# Patient Record
Sex: Male | Born: 1970 | Race: Black or African American | Hispanic: No | Marital: Married | Smoking: Never smoker
Health system: Southern US, Community
[De-identification: ages and names within clinical notes are randomized; demographics above are authoritative.]

## PROBLEM LIST (undated history)

## (undated) DIAGNOSIS — G4730 Sleep apnea, unspecified: Secondary | ICD-10-CM

## (undated) DIAGNOSIS — E119 Type 2 diabetes mellitus without complications: Secondary | ICD-10-CM

## (undated) DIAGNOSIS — I10 Essential (primary) hypertension: Secondary | ICD-10-CM

## (undated) DIAGNOSIS — M1990 Unspecified osteoarthritis, unspecified site: Secondary | ICD-10-CM

## (undated) DIAGNOSIS — R569 Unspecified convulsions: Secondary | ICD-10-CM

## (undated) DIAGNOSIS — T7840XA Allergy, unspecified, initial encounter: Secondary | ICD-10-CM

## (undated) HISTORY — DX: Unspecified osteoarthritis, unspecified site: M19.90

## (undated) HISTORY — DX: Type 2 diabetes mellitus without complications: E11.9

## (undated) HISTORY — DX: Allergy, unspecified, initial encounter: T78.40XA

---

## 2011-08-11 ENCOUNTER — Ambulatory Visit (HOSPITAL_BASED_OUTPATIENT_CLINIC_OR_DEPARTMENT_OTHER): Payer: BC Managed Care – PPO

## 2011-09-04 ENCOUNTER — Ambulatory Visit (HOSPITAL_BASED_OUTPATIENT_CLINIC_OR_DEPARTMENT_OTHER): Payer: BC Managed Care – PPO | Attending: Family Medicine | Admitting: Radiology

## 2011-09-04 VITALS — Ht 70.0 in | Wt 220.0 lb

## 2011-09-04 DIAGNOSIS — G4733 Obstructive sleep apnea (adult) (pediatric): Secondary | ICD-10-CM | POA: Insufficient documentation

## 2011-09-06 DIAGNOSIS — G4733 Obstructive sleep apnea (adult) (pediatric): Secondary | ICD-10-CM

## 2011-09-06 NOTE — Procedures (Signed)
[name], Terry York                ACCOUNT NO.:  [id number]  MEDICAL RECORD NO.:  [id number]          PATIENT TYPE:  OUT  LOCATION:  SLEEP CENTER                 FACILITY:  [facility]  PHYSICIAN:  Remus Hagedorn D. Maple Hudson, MD, FCCP, FACPDATE OF BIRTH:  12/18/1972  DATE OF STUDY:  09/04/2011                           NOCTURNAL POLYSOMNOGRAM  REFERRING PHYSICIAN:  Betti D. Pecola Leisure, M.D.  REFERRING PHYSICIAN:  Betti D. Pecola Leisure, M.D.  INDICATION FOR STUDY:  Hypersomnia with sleep apnea.  EPWORTH SLEEPINESS SCORE:  10/24.  BMI 31.6, weight 220 pounds, height 70 inches, neck 18 inches.  HOME MEDICATIONS:  Charted and reviewed.  SLEEP ARCHITECTURE:  Split study protocol.  During the diagnostic phase, total sleep time 121 minutes with sleep efficiency 80.9%.  Stage I was 18.6%, stage II 81%, stage III absent, REM 0.4% of total sleep time. Sleep latency 4.5 minutes, REM latency 86.5 minutes.  Awake after sleep onset 24.5 minutes.  Arousal index 52.1.  Bedtime medication:  None.  RESPIRATORY DATA:  Split study protocol.  Apnea/hypopnea index (AHI) 37.7 per hour.  A total of 76 events was scored including 12 obstructive apneas, 10 central apneas, 54 hypopneas.  Events were seen in all sleep positions.  CPAP was then titrated to 11 CWP, AHI 0 per hour.  He wore a large ResMed Swift mask with heated humidifier.  OXYGEN DATA:  Moderately loud snoring before CPAP with oxygen desaturation to a nadir of 86% on room air.  With CPAP titration, snoring was prevented and mean oxygen saturation held 97.8% on room air.  CARDIAC DATA:  Normal sinus rhythm.  MOVEMENT/PARASOMNIA:  No significant movement disturbance.  No bathroom trips periods.  IMPRESSION/RECOMMENDATION: 1. Severe obstructive and central sleep apnea/hypopnea syndrome, AHI     37.7 per hour with events in all sleep positions, moderately loud     snoring, and oxygen desaturation to a nadir of 86% on room air. 2. Successful CPAP titration to 11 CWP,  AHI 0 per hour.  He wore a     large ResMed Swift FT mask with heated humidifier.  Snoring was     prevented and mean oxygen saturation held 97.8% on room air.     Luz Burcher D. Maple Hudson, MD, [facility], FACP Diplomate, American Board of Sleep Medicine    CDY/MEDQ  D:  09/06/2011 09:42:40  T:  09/06/2011 10:03:36  Job:  [id number]

## 2014-12-12 ENCOUNTER — Other Ambulatory Visit: Payer: Self-pay | Admitting: Gastroenterology

## 2014-12-27 ENCOUNTER — Encounter (HOSPITAL_COMMUNITY): Payer: Self-pay | Admitting: *Deleted

## 2015-01-05 ENCOUNTER — Ambulatory Visit (HOSPITAL_COMMUNITY): Payer: BC Managed Care – PPO | Admitting: Registered Nurse

## 2015-01-05 ENCOUNTER — Encounter (HOSPITAL_COMMUNITY): Payer: Self-pay | Admitting: *Deleted

## 2015-01-05 ENCOUNTER — Ambulatory Visit (HOSPITAL_COMMUNITY)
Admission: RE | Admit: 2015-01-05 | Discharge: 2015-01-05 | Disposition: A | Discharge status: Home / Self Care | Payer: BC Managed Care – PPO | Source: Ambulatory Visit | Attending: Gastroenterology | Admitting: Gastroenterology

## 2015-01-05 ENCOUNTER — Encounter (HOSPITAL_COMMUNITY)
Admission: RE | Disposition: A | Discharge status: Home / Self Care | Payer: Self-pay | Source: Ambulatory Visit | Attending: Gastroenterology

## 2015-01-05 DIAGNOSIS — Z1211 Encounter for screening for malignant neoplasm of colon: Secondary | ICD-10-CM | POA: Insufficient documentation

## 2015-01-05 DIAGNOSIS — D124 Benign neoplasm of descending colon: Secondary | ICD-10-CM | POA: Diagnosis not present

## 2015-01-05 DIAGNOSIS — K602 Anal fissure, unspecified: Secondary | ICD-10-CM | POA: Diagnosis not present

## 2015-01-05 DIAGNOSIS — G4730 Sleep apnea, unspecified: Secondary | ICD-10-CM | POA: Diagnosis not present

## 2015-01-05 DIAGNOSIS — K621 Rectal polyp: Secondary | ICD-10-CM | POA: Insufficient documentation

## 2015-01-05 DIAGNOSIS — K644 Residual hemorrhoidal skin tags: Secondary | ICD-10-CM | POA: Insufficient documentation

## 2015-01-05 DIAGNOSIS — Z800 Family history of malignant neoplasm of digestive organs: Secondary | ICD-10-CM | POA: Diagnosis not present

## 2015-01-05 DIAGNOSIS — K642 Third degree hemorrhoids: Secondary | ICD-10-CM | POA: Diagnosis not present

## 2015-01-05 HISTORY — PX: COLONOSCOPY WITH PROPOFOL: SHX5780

## 2015-01-05 HISTORY — DX: Sleep apnea, unspecified: G47.30

## 2015-01-05 SURGERY — COLONOSCOPY WITH PROPOFOL
Anesthesia: Monitor Anesthesia Care

## 2015-01-05 MED ORDER — PROPOFOL 10 MG/ML IV BOLUS
INTRAVENOUS | Status: AC
  Filled 2015-01-05: qty 20

## 2015-01-05 MED ORDER — PROPOFOL 10 MG/ML IV BOLUS
INTRAVENOUS | Status: DC | PRN
  Administered 2015-01-05: 20 mg via INTRAVENOUS

## 2015-01-05 MED ORDER — PROPOFOL 500 MG/50ML IV EMUL
INTRAVENOUS | Status: DC | PRN
  Administered 2015-01-05: 125 ug/kg/min via INTRAVENOUS

## 2015-01-05 MED ORDER — LIDOCAINE HCL (CARDIAC) 20 MG/ML IV SOLN
INTRAVENOUS | Status: DC | PRN
  Administered 2015-01-05: 100 mg via INTRAVENOUS

## 2015-01-05 MED ORDER — LIDOCAINE HCL (CARDIAC) 20 MG/ML IV SOLN
INTRAVENOUS | Status: AC
  Filled 2015-01-05: qty 5

## 2015-01-05 MED ORDER — LACTATED RINGERS IV SOLN
INTRAVENOUS | Status: DC
  Administered 2015-01-05: 1000 mL via INTRAVENOUS

## 2015-01-05 MED ORDER — SODIUM CHLORIDE 0.9 % IV SOLN: INTRAVENOUS | Status: DC

## 2015-01-05 SURGICAL SUPPLY — 21 items

## 2015-01-05 NOTE — Anesthesia Preprocedure Evaluation (Addendum)
Anesthesia Evaluation  Patient identified by MRN, date of birth, ID band Patient awake    Reviewed: Allergy & Precautions, NPO status , Patient's Chart, lab work & pertinent test results  Airway Mallampati: II   Neck ROM: Full    Dental   Pulmonary neg pulmonary ROS,    breath sounds clear to auscultation       Cardiovascular negative cardio ROS   Rhythm:Regular Rate:Normal     Neuro/Psych    GI/Hepatic Neg liver ROS, GI history noted. CE   Endo/Other    Renal/GU negative Renal ROS     Musculoskeletal   Abdominal   Peds  Hematology   Anesthesia Other Findings   Reproductive/Obstetrics                           Anesthesia Physical Anesthesia Plan  ASA: II  Anesthesia Plan: MAC   Post-op Pain Management:    Induction: Intravenous  Airway Management Planned: Nasal Cannula, Natural Airway and Simple Face Mask  Additional Equipment:   Intra-op Plan:   Post-operative Plan:   Informed Consent: I have reviewed the patients History and Physical, chart, labs and discussed the procedure including the risks, benefits and alternatives for the proposed anesthesia with the patient or authorized representative who has indicated his/her understanding and acceptance.   Dental advisory given  Plan Discussed with: CRNA and Anesthesiologist  Anesthesia Plan Comments:        Anesthesia Quick Evaluation

## 2015-01-05 NOTE — Discharge Instructions (Signed)
Monitored Anesthesia Care Monitored anesthesia care is an anesthesia service for a medical procedure. Anesthesia is the loss of the ability to feel pain. It is produced by medicines called anesthetics. It may affect a small area of your body (local anesthesia), a large area of your body (regional anesthesia), or your entire body (general anesthesia). The need for monitored anesthesia care depends your procedure, your condition, and the potential need for regional or general anesthesia. It is often provided during procedures where:   General anesthesia may be needed if there are complications. This is because you need special care when you are under general anesthesia.   You will be under local or regional anesthesia. This is so that you are able to have higher levels of anesthesia if needed.   You will receive calming medicines (sedatives). This is especially the case if sedatives are given to put you in a semi-conscious state of relaxation (deep sedation). This is because the amount of sedative needed to produce this state can be hard to predict. Too much of a sedative can produce general anesthesia. Monitored anesthesia care is performed by one or more health care providers who have special training in all types of anesthesia. You will need to meet with these health care providers before your procedure. During this meeting, they will ask you about your medical history. They will also give you instructions to follow. (For example, you will need to stop eating and drinking before your procedure. You may also need to stop or change medicines you are taking.) During your procedure, your health care providers will stay with you. They will:   Watch your condition. This includes watching your blood pressure, breathing, and level of pain.   Diagnose and treat problems that occur.   Give medicines if they are needed. These may include calming medicines (sedatives) and anesthetics.   Make sure you are  comfortable.  Having monitored anesthesia care does not necessarily mean that you will be under anesthesia. It does mean that your health care providers will be able to manage anesthesia if you need it or if it occurs. It also means that you will be able to have a different type of anesthesia than you are having if you need it. When your procedure is complete, your health care providers will continue to watch your condition. They will make sure any medicines wear off before you are allowed to go home.  Document Released: 12/18/2004 Document Revised: 08/08/2013 Document Reviewed: 05/05/2012 [facility] Patient Information 2015 [place], [place]. This information is not intended to replace advice given to you by your health care provider. Make sure you discuss any questions you have with your health care provider. Colonoscopy, Care After Refer to this sheet in the next few weeks. These instructions provide you with information on caring for yourself after your procedure. Your health care provider may also give you more specific instructions. Your treatment has been planned according to current medical practices, but problems sometimes occur. Call your health care provider if you have any problems or questions after your procedure. WHAT TO EXPECT AFTER THE PROCEDURE  After your procedure, it is typical to have the following:  A small amount of blood in your stool.  Moderate amounts of gas and mild abdominal cramping or bloating. HOME CARE INSTRUCTIONS  Do not drive, operate machinery, or sign important documents for 24 hours.  You may shower and resume your regular physical activities, but move at a slower pace for the first 24 hours.  Take frequent rest periods for the first 24 hours.  Walk around or put a warm pack on your abdomen to help reduce abdominal cramping and bloating.  Drink enough fluids to keep your urine clear or pale yellow.  You may resume your normal diet as instructed by your  health care provider. Avoid heavy or fried foods that are hard to digest.  Avoid drinking alcohol for 24 hours or as instructed by your health care provider.  Only take over-the-counter or prescription medicines as directed by your health care provider.  If a tissue sample (biopsy) was taken during your procedure:  Do not take aspirin or blood thinners for 7 days, or as instructed by your health care provider.  Do not drink alcohol for 7 days, or as instructed by your health care provider.  Eat soft foods for the first 24 hours. SEEK MEDICAL CARE IF: You have persistent spotting of blood in your stool 2-3 days after the procedure. SEEK IMMEDIATE MEDICAL CARE IF:  You have more than a small spotting of blood in your stool.  You pass large blood clots in your stool.  Your abdomen is swollen (distended).  You have nausea or vomiting.  You have a fever.  You have increasing abdominal pain that is not relieved with medicine. Document Released: 11/06/2003 Document Revised: 01/12/2013 Document Reviewed: 11/29/2012 [facility] Patient Information 2015 [place], [place]. This information is not intended to replace advice given to you by your health care provider. Make sure you discuss any questions you have with your health care provider.

## 2015-01-05 NOTE — Op Note (Signed)
[facility] [place], [address]   COLONOSCOPY PROCEDURE REPORT  PATIENT: Terry York, Terry York  MR#: [id number] BIRTHDATE: September 14, 1970 , 44  yrs. old GENDER: male ENDOSCOPIST: Carol Ada, MD REFERRED BY: PROCEDURE DATE:  10-Jan-2015 PROCEDURE:   Colonoscopy with snare polypectomy ASA CLASS:   Class III INDICATIONS: Choledocholithiasis MEDICATIONS: Monitored anesthesia care  DESCRIPTION OF PROCEDURE:   After the risks and benefits and of the procedure were explained, informed consent was obtained.  Prolapsed large int/ext hemorrhoids         The Pentax Ped Colon [id number] endoscope was introduced through the anus and advanced to the cecum, which was identified by both the appendix and ileocecal valve .  The quality of the prep was excellent. .  The instrument was then slowly withdrawn as the colon was fully examined. Estimated blood loss is zero unless otherwise noted in this procedure report.   FINDINGS: Around the splenic flexure region to 1 cm pedunculated polyps were removed with a hot snare.  A 2 mm sessile polyp in the smae region was removed with a cold snare.  No evidence of any inflammation, ulcerations, erosions, or vascualr abnormalities. Retroflexed views revealed no abnormalities.     The scope was then withdrawn from the patient and the procedure completed.  WITHDRAWAL TIME: 12 minutes  COMPLICATIONS: There were no immediate complications. ENDOSCOPIC IMPRESSION: 1) Polyps 2) Grade 3 prolapse of int and ext hemorrhoids.  RECOMMENDATIONS: 1) Follow up biopsie results. 2) Repeat the colonoscopy in 3 years.  REPEAT EXAM:  cc: _______________________________ eSignedCarol Ada, MD 01/10/2015 10:27 AM  CPT CODES: ICD CODES:

## 2015-01-05 NOTE — Anesthesia Postprocedure Evaluation (Signed)
  Anesthesia Post-op Note  Patient: Terry York  Procedure(s) Performed: Procedure(s): COLONOSCOPY WITH PROPOFOL (N/A)  Patient Location: PACU  Anesthesia Type:MAC  Level of Consciousness: awake  Airway and Oxygen Therapy: Patient Spontanous Breathing  Post-op Pain: mild  Post-op Assessment: Post-op Vital signs reviewed              Post-op Vital Signs: Reviewed  Last Vitals:  Filed Vitals:   01/05/15 1040  BP: 143/90  Temp:   Resp:     Complications: No apparent anesthesia complications

## 2015-01-05 NOTE — Anesthesia Postprocedure Evaluation (Signed)
  Anesthesia Post-op Note  Patient: Terry York  Procedure(s) Performed: Procedure(s): COLONOSCOPY WITH PROPOFOL (N/A)  Patient Location: PACU  Anesthesia Type:MAC  Level of Consciousness: alert   Airway and Oxygen Therapy: Patient Spontanous Breathing  Post-op Pain: mild  Post-op Assessment: Post-op Vital signs reviewed              Post-op Vital Signs: Reviewed  Last Vitals:  Filed Vitals:   01/05/15 0845  BP: 174/97  Temp: 36.8 C  Resp: 16    Complications: No apparent anesthesia complications

## 2015-01-05 NOTE — Anesthesia Procedure Notes (Signed)
Procedure Name: MAC Date/Time: 01/05/2015 9:58 AM Performed by: Carleene Cooper A Pre-anesthesia Checklist: Patient identified, Timeout performed, Emergency Drugs available, Suction available and Patient being monitored Patient Re-evaluated:Patient Re-evaluated prior to inductionOxygen Delivery Method: Simple face mask Preoxygenation: Pre-oxygenation with 100% oxygen Dental Injury: Teeth and Oropharynx as per pre-operative assessment

## 2015-01-05 NOTE — Transfer of Care (Signed)
Immediate Anesthesia Transfer of Care Note  Patient: Terry York  Procedure(s) Performed: Procedure(s): COLONOSCOPY WITH PROPOFOL (N/A)  Patient Location: PACU  Anesthesia Type:MAC  Level of Consciousness: awake, alert , oriented and patient cooperative  Airway & Oxygen Therapy: Patient Spontanous Breathing and Patient connected to face mask oxygen  Post-op Assessment: Report given to RN, Post -op Vital signs reviewed and stable and Patient moving all extremities  Post vital signs: Reviewed and stable  Last Vitals:  Filed Vitals:   01/05/15 0845  BP: 174/97  Temp: 36.8 C  Resp: 16    Complications: No apparent anesthesia complications

## 2015-01-05 NOTE — H&P (Signed)
  Terry York HPI: This is a 44 year old male with a family history of colon cancer in his maternal grandmother at the age of 44.  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  No prior colonoscopy and no complaints of any GI issues at this time, i.e., hematochezia, melena, ABM pain, diarrhea, or constipation.  He has an AHI score of 37.7 for his sleep apnea.  Past Medical History  Diagnosis Date  . Sleep apnea     History reviewed. No pertinent past surgical history.  History reviewed. No pertinent family history.  Social History:  reports that he has never smoked. He does not have any smokeless tobacco history on file. He reports that he drinks alcohol. He reports that he does not use illicit drugs.  Allergies: No Known Allergies  Medications:  Scheduled:  Continuous: . lactated ringers 1,000 mL (01/05/15 0841)    No results found for this or any previous visit (from the past 24 hour(s)).   No results found.  ROS:  As stated above in the HPI otherwise negative.  Blood pressure 174/97, temperature 98.3 F (36.8 C), temperature source Oral, resp. rate 16, height 5\' 10"  (1.778 m), weight 108.863 kg (240 lb), SpO2 97 %.    PE: Gen: NAD, Alert and Oriented HEENT:  [place]/AT, EOMI Neck: Supple, no LAD Lungs: CTA Bilaterally CV: RRR without M/G/R ABM: Soft, NTND, +BS Ext: No C/C/E  Assessment/Plan: 1) Family history of colon cancer - Screening colonoscopy.  HUNG,PATRICK D 01/05/2015, 9:46 AM

## 2017-08-11 ENCOUNTER — Encounter: Payer: Self-pay | Admitting: Sports Medicine

## 2017-08-11 ENCOUNTER — Ambulatory Visit: Payer: BC Managed Care – PPO | Admitting: Sports Medicine

## 2017-08-11 ENCOUNTER — Ambulatory Visit (INDEPENDENT_AMBULATORY_CARE_PROVIDER_SITE_OTHER): Payer: BC Managed Care – PPO

## 2017-08-11 ENCOUNTER — Other Ambulatory Visit: Payer: Self-pay | Admitting: *Deleted

## 2017-08-11 ENCOUNTER — Encounter

## 2017-08-11 VITALS — BP 148/96 | HR 83 | Resp 16

## 2017-08-11 DIAGNOSIS — M2011 Hallux valgus (acquired), right foot: Secondary | ICD-10-CM | POA: Diagnosis not present

## 2017-08-11 DIAGNOSIS — M1000 Idiopathic gout, unspecified site: Secondary | ICD-10-CM

## 2017-08-11 DIAGNOSIS — M7662 Achilles tendinitis, left leg: Secondary | ICD-10-CM | POA: Diagnosis not present

## 2017-08-11 DIAGNOSIS — M779 Enthesopathy, unspecified: Secondary | ICD-10-CM

## 2017-08-11 MED ORDER — IBUPROFEN 800 MG PO TABS: 800.0000 mg | ORAL_TABLET | Freq: Two times a day (BID) | ORAL | 0 refills | Status: DC

## 2017-08-11 MED ORDER — TRIAMCINOLONE ACETONIDE 10 MG/ML IJ SUSP: 10.0000 mg | Freq: Once | INTRAMUSCULAR | Status: DC

## 2017-08-11 MED ORDER — METHYLPREDNISOLONE 4 MG PO TBPK: ORAL_TABLET | ORAL | 0 refills | Status: DC

## 2017-08-11 NOTE — Patient Instructions (Addendum)

## 2017-08-11 NOTE — Progress Notes (Signed)
Subjective: Terry York is a 47 y.o. male patient who presents to office for evaluation of Right> Left foot pain. Patient complains of progressive pain especially over the since May 3rd Right>Left foot at the big toe joint. Ranks pain 7/10. Admits to warmth, redness, and swelling to the area that is unrelieved. Patient has tried rest, ice, elevation with no relief in symptoms. Admits to previous gouty attack years ago. Patient also states that the back of his left heel was hurting but now better. Patient denies any other pedal complaints.   Review of Systems  Musculoskeletal: Positive for joint pain.  All other systems reviewed and are negative.   There are no active problems to display for this patient.   Current Outpatient Medications on File Prior to Visit  Medication Sig Dispense Refill  . diltiazem 2 % GEL Apply 1 application topically 3 (three) times daily.     No current facility-administered medications on file prior to visit.     No Known Allergies  Objective:  General: Alert and oriented x3 in no acute distress  Dermatology: Focal Swelling, warmth, redness present on the Right foot at 1st MTPJ, No open lesions bilateral lower extremities, no webspace macerations, no ecchymosis bilateral, all nails x 10 are well manicured.   Vascular: Dorsalis Pedis and Posterior Tibial pedal pulses 1/4, Capillary Fill Time 3 seconds,(+) pedal hair growth bilateral,Temperature gradient increased over the Right 1st MTPJ.  Neurology: [name] sensation intact via light touch bilateral.  Musculoskeletal: There is tenderness with palpation at 1st MTPJ on right foot,No pain with calf compression bilateral. All joint range of motion is within normal limits except at the right 1st MTPJ where there is pain and limiation, Strength within normal limits in all groups bilateral. Resolved insertion pain at left achilles with heel spur.   Gait: Unassisted, Antalgic gait avoiding weight on left/right  foot  Xrays  Left/Right Foot    Impression: Soft tissue swelling at right 1st MTPJ, calcaneal spur, mild arthritis and bunion on right, no other acute findings.        Assessment and Plan: Problem List Items Addressed This Visit    None    Visit Diagnoses    Acute idiopathic gout, unspecified site    -  Primary   Relevant Medications   triamcinolone acetonide (KENALOG) 10 MG/ML injection 10 mg   Other Relevant Orders   ANA, IFA Comprehensive Panel   C-reactive protein   Uric acid   Sedimentation rate   Rheumatoid factor   Hallux valgus of right foot       Relevant Orders   DG Foot Complete Right (Completed)   Tendonitis, Achilles, left       Relevant Orders   DG Foot Complete Left (Completed)       -Complete examination performed -Xrays reviewed -Discussed treatment for achilles tendonitis; recommend cont to stretch to prevent recurrence  -Discussed treatement options for gouty arthritis and gout education provided - After oral consent, injected right 1st MTPJ with 1cc lidocaine and marcaine plain mixed with 0.5cc Kenalog-10 and Dexmethasone phosphate without complication; post injection care explained. -Rx Medrol  -Ordered arthritic lab panel; will call patient with results if abnormal -Advised patient to call if symptoms are not improved within 1 week -Patient to return in 3-4 weeks for re-check/further discussion for long term management of gout or sooner if condition worsens.  Landis Martins, DPM'

## 2017-08-13 ENCOUNTER — Telehealth: Payer: Self-pay | Admitting: Sports Medicine

## 2017-08-13 LAB — ANA, IFA COMPREHENSIVE PANEL
Anti Nuclear Antibody(ANA): NEGATIVE
ENA SM AB SER-ACNC: NEGATIVE AI
SM/RNP: NEGATIVE AI
SSA (Ro) (ENA) Antibody, IgG: <1 AI
SSB (La) (ENA) Antibody, IgG: <1 AI
Scleroderma (Scl-70) (ENA) Antibody, IgG: <1 AI
ds DNA Ab: 1 IU/mL

## 2017-08-13 LAB — SEDIMENTATION RATE: Sed Rate: 11 mm/h (ref 0–15)

## 2017-08-13 LAB — C-REACTIVE PROTEIN: CRP: 14 mg/L — ABNORMAL HIGH (ref <8.0)

## 2017-08-13 LAB — URIC ACID: URIC ACID, SERUM: 5.9 mg/dL (ref 4.0–8.0)

## 2017-08-13 LAB — RHEUMATOID FACTOR: Rhuematoid fact SerPl-aCnc: <14 IU/mL (ref <14)

## 2017-08-13 NOTE — Telephone Encounter (Signed)
I told pt the uric acid was normal, but he could still experience symptoms of gout, and once Dr. Cannon Kettle had reviewed the remaining labs, I would call with further instructions. Pt states understanding.

## 2017-08-13 NOTE — Telephone Encounter (Signed)
I was calling to see if my lab results have come in regarding my uric acid level? I'm just trying to follow up on my visit from a couple of days ago, and if I have any additional instructions for my care, that would be very useful. My number is [phone]. Thank you much. Bye bye.

## 2017-08-19 ENCOUNTER — Ambulatory Visit: Payer: BC Managed Care – PPO | Admitting: Podiatry

## 2017-08-26 ENCOUNTER — Other Ambulatory Visit: Payer: Self-pay | Admitting: Sports Medicine

## 2017-09-01 ENCOUNTER — Ambulatory Visit: Payer: BC Managed Care – PPO | Admitting: Sports Medicine

## 2017-09-08 ENCOUNTER — Encounter: Payer: Self-pay | Admitting: Sports Medicine

## 2017-09-08 ENCOUNTER — Ambulatory Visit: Payer: BC Managed Care – PPO | Admitting: Sports Medicine

## 2017-09-08 DIAGNOSIS — M7662 Achilles tendinitis, left leg: Secondary | ICD-10-CM | POA: Diagnosis not present

## 2017-09-08 DIAGNOSIS — M2011 Hallux valgus (acquired), right foot: Secondary | ICD-10-CM

## 2017-09-08 DIAGNOSIS — M1000 Idiopathic gout, unspecified site: Secondary | ICD-10-CM

## 2017-09-08 NOTE — Progress Notes (Signed)
Subjective: Terry York is a 47 y.o. male patient who returns to office for follow up evaluation of bilateral foot pain. Patient now ranks pain 1/10 and states that he is feeling better after injection on right and completed medication for left. Admits to a sprain of last week that is also better as well. Patient denies any other pedal complaints.   There are no active problems to display for this patient.  Current Outpatient Medications on File Prior to Visit  Medication Sig Dispense Refill  . B-D TB SYRINGE 1CC/27GX1/2" 27G X 1/2" 1 ML MISC See admin instructions.  6  . cyanocobalamin (,VITAMIN B-12,) 1000 MCG/ML injection See admin instructions.  6  . diltiazem 2 % GEL Apply 1 application topically 3 (three) times daily.    . hydrocortisone (ANUSOL-HC) 25 MG suppository     . ibuprofen (ADVIL,MOTRIN) 800 MG tablet TAKE 1 TABLET (800 MG TOTAL) BY MOUTH 2 (TWO) TIMES DAILY. 30 tablet 0  . ibuprofen (ADVIL,MOTRIN) 800 MG tablet TAKE 1 TABLET (800 MG TOTAL) BY MOUTH 2 (TWO) TIMES DAILY. 30 tablet 0  . imiquimod (ALDARA) 5 % cream     . lisinopril (PRINIVIL,ZESTRIL) 20 MG tablet TAKE 1 TABLET (20 MG) BY ORAL ROUTE ONCE DAILY  1  . methylPREDNISolone (MEDROL DOSEPAK) 4 MG TBPK tablet Take as directed 21 tablet 0  . rosuvastatin (CRESTOR) 10 MG tablet Take 10 mg by mouth daily.  4  . tadalafil (CIALIS) 5 MG tablet TAKE 1 TABLET (5 MG) BY ORAL ROUTE ONCE DAILY  4  . testosterone cypionate (DEPOTESTOSTERONE CYPIONATE) 200 MG/ML injection INJECT 1MLS INTRAMUSCULARLY EVERY 2 WEEKS  5   Current Facility-Administered Medications on File Prior to Visit  Medication Dose Route Frequency Provider Last Rate Last Dose  . triamcinolone acetonide (KENALOG) 10 MG/ML injection 10 mg  10 mg Other Once Landis Martins, DPM       No Known Allergies  Objective:  General: Alert and oriented x3 in no acute distress  Dermatology: No redness, swelling or warmth at Right 1st MTPJ, No open lesions bilateral lower  extremities, no webspace macerations, no ecchymosis bilateral, all nails x 10 are well manicured.  Neurovascular: Intact. No lower extremity edema bilateral.   Musculoskeletal:No tenderness with palpation at R 1st MTPJ,No pain with calf compression bilateral. Range of motion in all pedal joints are within normal limits without pain or crepitation. Strength within normal limits in all groups bilateral, no reproducible pain with stressing right midtarsal joint or to left achilles insertion.   Assessment and Plan: Problem List Items Addressed This Visit    None    Visit Diagnoses    Hallux valgus of right foot    -  Primary   Acute idiopathic gout, unspecified site       Tendonitis, Achilles, left          -Complete examination performed -Discussed long term care for bunion with hx of gout on right and tendonitis on left -Previous labs were reviewed  -Recommend follow up with PCP for long term gout management -Encouraged dietary management for gout -Recommend good supportive shoes to avoid rubbing to right bunion -Continue with stretching exercises for left and orthotics  -Patient to return to office as needed or sooner if condition worsens.  Landis Martins, DPM

## 2017-09-13 ENCOUNTER — Other Ambulatory Visit: Payer: Self-pay | Admitting: Sports Medicine

## 2017-10-09 ENCOUNTER — Other Ambulatory Visit: Payer: Self-pay | Admitting: Sports Medicine

## 2017-12-21 ENCOUNTER — Ambulatory Visit
Admission: RE | Admit: 2017-12-21 | Discharge: 2017-12-21 | Disposition: A | Discharge status: Home / Self Care | Payer: BC Managed Care – PPO | Source: Ambulatory Visit | Attending: Family Medicine | Admitting: Family Medicine

## 2017-12-21 ENCOUNTER — Other Ambulatory Visit: Payer: Self-pay | Admitting: Family Medicine

## 2017-12-21 DIAGNOSIS — S6992XA Unspecified injury of left wrist, hand and finger(s), initial encounter: Secondary | ICD-10-CM

## 2017-12-22 ENCOUNTER — Other Ambulatory Visit: Payer: Self-pay | Admitting: Gastroenterology

## 2018-01-12 ENCOUNTER — Encounter (HOSPITAL_COMMUNITY): Admission: RE | Payer: Self-pay | Source: Ambulatory Visit

## 2018-01-12 ENCOUNTER — Ambulatory Visit (HOSPITAL_COMMUNITY)
Admission: RE | Admit: 2018-01-12 | Payer: BC Managed Care – PPO | Source: Ambulatory Visit | Admitting: Gastroenterology

## 2018-01-12 SURGERY — COLONOSCOPY WITH PROPOFOL
Anesthesia: Monitor Anesthesia Care

## 2018-05-12 ENCOUNTER — Other Ambulatory Visit: Payer: Self-pay | Admitting: Gastroenterology

## 2018-08-12 ENCOUNTER — Ambulatory Visit (HOSPITAL_COMMUNITY): Admit: 2018-08-12 | Payer: BC Managed Care – PPO | Admitting: Gastroenterology

## 2018-08-12 ENCOUNTER — Encounter (HOSPITAL_COMMUNITY): Payer: Self-pay

## 2018-08-12 SURGERY — COLONOSCOPY WITH PROPOFOL
Anesthesia: Monitor Anesthesia Care

## 2018-10-04 ENCOUNTER — Other Ambulatory Visit: Payer: Self-pay | Admitting: Gastroenterology

## 2018-11-01 ENCOUNTER — Other Ambulatory Visit (HOSPITAL_COMMUNITY)
Admission: RE | Admit: 2018-11-01 | Discharge: 2018-11-01 | Disposition: A | Discharge status: Home / Self Care | Payer: BC Managed Care – PPO | Source: Ambulatory Visit | Attending: Gastroenterology | Admitting: Gastroenterology

## 2018-11-01 DIAGNOSIS — Z20828 Contact with and (suspected) exposure to other viral communicable diseases: Secondary | ICD-10-CM | POA: Insufficient documentation

## 2018-11-01 LAB — SARS CORONAVIRUS 2 (TAT 6-24 HRS): SARS Coronavirus 2: NEGATIVE

## 2018-11-02 ENCOUNTER — Encounter (HOSPITAL_COMMUNITY): Payer: Self-pay | Admitting: *Deleted

## 2018-11-02 ENCOUNTER — Other Ambulatory Visit: Payer: Self-pay

## 2018-11-03 NOTE — Anesthesia Preprocedure Evaluation (Addendum)
Anesthesia Evaluation  Patient identified by MRN, date of birth, ID band Patient awake    Reviewed: Allergy & Precautions, NPO status , Patient's Chart, lab work & pertinent test results  Airway Mallampati: III  TM Distance: >3 FB Neck ROM: Full    Dental no notable dental hx.    Pulmonary sleep apnea and Continuous Positive Airway Pressure Ventilation ,    Pulmonary exam normal breath sounds clear to auscultation       Cardiovascular hypertension, Pt. on medications Normal cardiovascular exam Rhythm:Regular Rate:Normal     Neuro/Psych negative neurological ROS  negative psych ROS   GI/Hepatic negative GI ROS, Neg liver ROS,   Endo/Other  negative endocrine ROS  Renal/GU negative Renal ROS     Musculoskeletal negative musculoskeletal ROS (+)   Abdominal (+) + obese,   Peds  Hematology negative hematology ROS (+)   Anesthesia Other Findings screening/fm hx colon cancer  Reproductive/Obstetrics                            Anesthesia Physical Anesthesia Plan  ASA: III  Anesthesia Plan: MAC   Post-op Pain Management:    Induction: Intravenous  PONV Risk Score and Plan: 1 and Propofol infusion and Treatment may vary due to age or medical condition  Airway Management Planned: Simple Face Mask  Additional Equipment:   Intra-op Plan:   Post-operative Plan:   Informed Consent: I have reviewed the patients History and Physical, chart, labs and discussed the procedure including the risks, benefits and alternatives for the proposed anesthesia with the patient or authorized representative who has indicated his/her understanding and acceptance.     Dental advisory given  Plan Discussed with: CRNA  Anesthesia Plan Comments:        Anesthesia Quick Evaluation

## 2018-11-04 ENCOUNTER — Encounter (HOSPITAL_COMMUNITY)
Admission: RE | Disposition: A | Discharge status: Home / Self Care | Payer: Self-pay | Source: Home / Self Care | Attending: Gastroenterology

## 2018-11-04 ENCOUNTER — Encounter (HOSPITAL_COMMUNITY): Payer: Self-pay | Admitting: *Deleted

## 2018-11-04 ENCOUNTER — Ambulatory Visit (HOSPITAL_COMMUNITY)
Admission: RE | Admit: 2018-11-04 | Discharge: 2018-11-04 | Disposition: A | Discharge status: Home / Self Care | Payer: BC Managed Care – PPO | Attending: Gastroenterology | Admitting: Gastroenterology

## 2018-11-04 ENCOUNTER — Ambulatory Visit (HOSPITAL_COMMUNITY): Payer: BC Managed Care – PPO | Admitting: Anesthesiology

## 2018-11-04 ENCOUNTER — Other Ambulatory Visit: Payer: Self-pay

## 2018-11-04 DIAGNOSIS — I10 Essential (primary) hypertension: Secondary | ICD-10-CM | POA: Insufficient documentation

## 2018-11-04 DIAGNOSIS — Z800 Family history of malignant neoplasm of digestive organs: Secondary | ICD-10-CM | POA: Insufficient documentation

## 2018-11-04 DIAGNOSIS — E669 Obesity, unspecified: Secondary | ICD-10-CM | POA: Diagnosis not present

## 2018-11-04 DIAGNOSIS — K625 Hemorrhage of anus and rectum: Secondary | ICD-10-CM | POA: Diagnosis not present

## 2018-11-04 DIAGNOSIS — K644 Residual hemorrhoidal skin tags: Secondary | ICD-10-CM | POA: Insufficient documentation

## 2018-11-04 DIAGNOSIS — G4730 Sleep apnea, unspecified: Secondary | ICD-10-CM | POA: Diagnosis not present

## 2018-11-04 DIAGNOSIS — Z6834 Body mass index (BMI) 34.0-34.9, adult: Secondary | ICD-10-CM | POA: Diagnosis not present

## 2018-11-04 DIAGNOSIS — K648 Other hemorrhoids: Secondary | ICD-10-CM | POA: Diagnosis not present

## 2018-11-04 DIAGNOSIS — Z79899 Other long term (current) drug therapy: Secondary | ICD-10-CM | POA: Insufficient documentation

## 2018-11-04 DIAGNOSIS — Z86010 Personal history of colonic polyps: Secondary | ICD-10-CM | POA: Insufficient documentation

## 2018-11-04 HISTORY — PX: COLONOSCOPY WITH PROPOFOL: SHX5780

## 2018-11-04 HISTORY — DX: Essential (primary) hypertension: I10

## 2018-11-04 SURGERY — COLONOSCOPY WITH PROPOFOL
Anesthesia: Monitor Anesthesia Care

## 2018-11-04 MED ORDER — PROPOFOL 500 MG/50ML IV EMUL
INTRAVENOUS | Status: DC | PRN
  Administered 2018-11-04: 125 ug/kg/min via INTRAVENOUS

## 2018-11-04 MED ORDER — PROPOFOL 10 MG/ML IV BOLUS
INTRAVENOUS | Status: AC
  Filled 2018-11-04: qty 60

## 2018-11-04 MED ORDER — PROPOFOL 10 MG/ML IV BOLUS
INTRAVENOUS | Status: DC | PRN
  Administered 2018-11-04 (×2): 10 mg via INTRAVENOUS
  Administered 2018-11-04 (×3): 20 mg via INTRAVENOUS
  Administered 2018-11-04: 30 mg via INTRAVENOUS

## 2018-11-04 MED ORDER — LACTATED RINGERS IV SOLN
INTRAVENOUS | Status: DC
  Administered 2018-11-04: 07:00:00 1000 mL via INTRAVENOUS

## 2018-11-04 MED ORDER — SODIUM CHLORIDE 0.9 % IV SOLN: INTRAVENOUS | Status: DC

## 2018-11-04 SURGICAL SUPPLY — 22 items

## 2018-11-04 NOTE — H&P (Addendum)
Terry York is an 48 y.o. male.   Chief Complaint: Colorectal cancer screening. HPI: Mr. Terry York is a 48 year old black male who was here at [facility] for a screening colonoscopy.  He has had some rectal bleeding which he feels is from his hemorrhoids.  He had a previous colonoscopy when a tubular adenoma was removed there is a family history of colon cancer in his paternal grandmother who was diagnosed in her 61s  Past Medical History:  Diagnosis Date  . Hypertension   . Sleep apnea/Obesity/Gout    Past Surgical History:  Procedure Laterality Date  . COLONOSCOPY WITH PROPOFOL N/A 01/05/2015   Procedure: COLONOSCOPY WITH PROPOFOL;  Surgeon: Carol Ada, MD;  Location: [facility];  Service: Endoscopy;  Laterality: N/A;   History reviewed. No pertinent family history. Social History:  reports that he has never smoked. He has never used smokeless tobacco. He reports current alcohol use. He reports that he does not use drugs.  Allergies: No Known Allergies  Facility-Administered Medications Prior to Admission  Medication Dose Route Frequency Provider Last Rate Last Dose  . triamcinolone acetonide (KENALOG) 10 MG/ML injection 10 mg  10 mg Other Once Landis Martins, DPM       Medications Prior to Admission  Medication Sig Dispense Refill  . GAVILYTE-N WITH FLAVOR PACK 420 g solution Take 4,000 mLs by mouth once.    Marland Kitchen ibuprofen (ADVIL) 800 MG tablet Take 800 mg by mouth 2 (two) times daily as needed (pain.).    Marland Kitchen lisinopril (PRINIVIL,ZESTRIL) 20 MG tablet Take 20 mg by mouth daily.   1  . Omega-3 Fatty Acids (FISH OIL) 1000 MG CAPS Take 1,000 mg by mouth 2 (two) times a day.    . tadalafil (CIALIS) 5 MG tablet Take 5 mg by mouth daily.   4  . testosterone cypionate (DEPOTESTOSTERONE CYPIONATE) 200 MG/ML injection Inject 200 mg into the muscle every 14 (fourteen) days.   5  . vitamin C (ASCORBIC ACID) 500 MG tablet Take 500 mg by mouth 2 (two) times a day.    . Vitamin E  180 MG CAPS Take 180 mg by mouth 2 (two) times a day.    . B-D TB SYRINGE 1CC/27GX1/2" 27G X 1/2" 1 ML MISC See admin instructions.  6   Review of Systems  Constitutional: Negative.   HENT: Negative.   Eyes: Negative.   Respiratory: Negative.   Cardiovascular: Negative.   Gastrointestinal: Positive for blood in stool. Negative for abdominal pain, constipation, diarrhea, heartburn, nausea and vomiting.  Genitourinary: Negative.   Musculoskeletal: Positive for joint pain.  Skin: Negative.   Neurological: Negative.   Endo/Heme/Allergies: Negative.   Psychiatric/Behavioral: Negative.    Blood pressure (!) 159/89, pulse 73, temperature 98.6 F (37 C), temperature source Oral, resp. rate 13, height 5\' 10"  (1.778 m), weight 108 kg, SpO2 99 %. Physical Exam  Constitutional: He is oriented to person, place, and time. He appears well-developed and well-nourished.  HENT:  Head: Normocephalic and atraumatic.  Eyes: Pupils are equal, round, and reactive to light. Conjunctivae and EOM are normal.  Neck: Normal range of motion. Neck supple.  Cardiovascular: Normal rate and regular rhythm.  Respiratory: Effort normal and breath sounds normal.  GI: Soft. Bowel sounds are normal.  Musculoskeletal: Normal range of motion.  Neurological: He is alert and oriented to person, place, and time.  Skin: Skin is warm and dry.  Psychiatric: He has a normal mood and affect. His behavior is normal. Judgment and  thought content normal.    Assessment/Plan 1) Rectal bleeding/Family history of colon cancer/colorectal cancer screening; proceed with a colonoscopy at this time. 2) Sever sleep apnea.  Juanita Craver, MD 11/04/2018, 7:05 AM

## 2018-11-04 NOTE — Anesthesia Postprocedure Evaluation (Signed)
Anesthesia Post Note  Patient: Terry York  Procedure(s) Performed: COLONOSCOPY WITH PROPOFOL (N/A )     Patient location during evaluation: PACU Anesthesia Type: MAC Level of consciousness: awake and alert Pain management: pain level controlled Vital Signs Assessment: post-procedure vital signs reviewed and stable Respiratory status: spontaneous breathing, nonlabored ventilation, respiratory function stable and patient connected to nasal cannula oxygen Cardiovascular status: stable and blood pressure returned to baseline Postop Assessment: no apparent nausea or vomiting Anesthetic complications: no    Last Vitals:  Vitals:   11/04/18 0740 11/04/18 0755  BP: 118/61 (!) 144/92  Pulse: 82 71  Resp: 19 17  Temp: 36.7 C   SpO2: 99% 100%    Last Pain:  Vitals:   11/04/18 0755  TempSrc:   PainSc: 0-No pain                 Alby Schwabe P Zettie Gootee

## 2018-11-04 NOTE — Discharge Instructions (Signed)

## 2018-11-04 NOTE — Transfer of Care (Signed)
Immediate Anesthesia Transfer of Care Note  Patient: Elizjah Noblet  Procedure(s) Performed: COLONOSCOPY WITH PROPOFOL (N/A )  Patient Location: PACU  Anesthesia Type:MAC  Level of Consciousness: sedated  Airway & Oxygen Therapy: Patient Spontanous Breathing and Patient connected to nasal cannula oxygen  Post-op Assessment: Report given to RN and Post -op Vital signs reviewed and stable  Post vital signs: Reviewed and stable  Last Vitals:  Vitals Value Taken Time  BP 118/61 11/04/18 0740  Temp    Pulse 84 11/04/18 0741  Resp 21 11/04/18 0741  SpO2 97 % 11/04/18 0741  Vitals shown include unvalidated device data.  Last Pain:  Vitals:   11/04/18 0740  TempSrc: Temporal  PainSc:          Complications: No apparent anesthesia complications

## 2018-11-04 NOTE — Op Note (Signed)
[facility] Patient Name: Terry York Procedure Date: 11/04/2018 MRN: [id number] Attending MD: Juanita Craver , MD Date of Birth: 1971/02/05 CSN: [id number] Age: 48 Admit Type: Outpatient Procedure:                Screening colonoscopy. Indications:              CRC screening for colorectal malignant neoplasm;                            Family history of colon cancer; Rectal bleeding. Providers:                Juanita Craver, MD, Glori Bickers, RN, Cherylynn Ridges,                            Technician, Crestwood Alday CRNA Referring MD:             Lin Landsman, MD Medicines:                Monitored Anesthesia Care Complications:            No immediate complications. Estimated Blood Loss:     Estimated blood loss: none. Procedure:                Pre-Anesthesia Assessment: - Prior to the                            procedure, a history and physical was performed,                            and patient medications and allergies were                            reviewed. The patient's tolerance of previous                            anesthesia was also reviewed. The risks and                            benefits of the procedure and the sedation options                            and risks were discussed with the patient. All                            questions were answered, and informed consent was                            obtained. Prior Anticoagulants: The patient has                            taken no previous anticoagulant or antiplatelet                            agents. ASA Grade Assessment: II - A patient with  mild systemic disease. After reviewing the risks                            and benefits, the patient was deemed in                            satisfactory condition to undergo the procedure.                            After obtaining informed consent, the colonoscope                            was passed under direct vision.  Throughout the                            procedure, the patient's blood pressure, pulse, and                            oxygen saturations were monitored continuously. The                            CF-HQ190L ([id number]) Olympus colonoscope was                            introduced through the anus and advanced to the the                            terminal ileum, with identification of the                            appendiceal orifice and IC valve. The colonoscopy                            was performed without difficulty. The patient                            tolerated the procedure well. The quality of the                            bowel preparation was good. The terminal ileum, the                            ileocecal valve, the appendiceal orifice and the                            rectum were photographed. The bowel preparation                            used was GoLYTELY via split dose instruction. Scope In: 7:22:48 AM Scope Out: 7:33:47 AM Scope Withdrawal Time: 0 hours 4 minutes 31 seconds  Total Procedure Duration: 0 hours 10 minutes 59 seconds  Findings:      External hemorrhoids and prolapsing internal hemorrhoids noted on anal       inspection.  The entire examined portion of the colon appeared normal.      The terminal ileum appeared normal.      Internal hemorrhoids were noted on retroflexion. Impression:               - External hemorrhoids and prolpasing internal                            hemorrhoids noted on perianal exam.                           - The entire examined colon is normal.                           - The examined portion of the ileum was normal.                           - Internal hemorrhoids noted on retroflexion.                           - No specimens collected. Moderate Sedation:      MAC used. Recommendation:           - High fiber diet with augmented water consumption                            daily.                           -  Continue present medications.                           - Repeat colonoscopy in 10 years for surveillance.                           - Return to GI office PRN.                           - A referral will be made to a surgeon ASAP for a                            hemorrhoidectomy..                           - If the patient has any abnormal GI symptoms in                            the interim, he has been advised to call the office                            ASAP for further recommendations. Procedure Code(s):        --- Professional ---                           [phone], Colonoscopy, flexible; diagnostic, including  collection of specimen(s) by brushing or washing,                            when performed (separate procedure) Diagnosis Code(s):        --- Professional ---                           Z12.11, Encounter for screening for malignant                            neoplasm of colon                           Z80.0, Family history of malignant neoplasm of                            digestive organs                           K62.5, Hemorrhage of anus and rectum                           K64.8, Other hemorrhoids CPT copyright 2019 [employer]. All rights reserved. The codes documented in this report are preliminary and upon coder review may  be revised to meet current compliance requirements. Juanita Craver, MD Juanita Craver, MD 11/04/2018 7:48:15 AM This report has been signed electronically. Number of Addenda: 0

## 2018-11-05 ENCOUNTER — Encounter (HOSPITAL_COMMUNITY): Payer: Self-pay | Admitting: Gastroenterology

## 2018-11-22 ENCOUNTER — Ambulatory Visit: Payer: Self-pay | Admitting: General Surgery

## 2018-11-22 NOTE — H&P (Signed)
History of Present Illness Leighton Ruff MD; 1/61/0960 11:40 AM) The patient is a 48 year old male who presents with hemorrhoids. 40s. Initial male who presents to the office with approximately 5-6 years of rectal pain. He experiences occasional bleeding. He did undergo colonoscopy with Dr. Collene Mares, which showed internal hemorrhoids. He does report some prolapsing tissue with bowel movements. He has a history of chronic constipation but has increased his fiber, water intake recently. This causes him to have a bowel movement every day. He reports sharp pain with bowel movements and pain afterwards. He has been diagnosed with a fissure in the past and is taken medications for this. The patient has underwent 2 colonoscopies in the past 5 years with no sign of malignancy noted.   Past Surgical History Emeline Gins, [place]; 11/22/2018 11:10 AM) No pertinent past surgical history  Diagnostic Studies History Emeline Gins, [place]; 11/22/2018 11:10 AM) Colonoscopy within last year  Allergies Emeline Gins, CMA; 11/22/2018 11:11 AM) No Known Drug Allergies [11/22/2018]: Allergies Reconciled  Medication History Emeline Gins, CMA; 11/22/2018 11:12 AM) Lisinopril (20MG  Tablet, Oral) Active. Tadalafil (5MG  Tablet, Oral) Active. Temazepam (15MG  Capsule, Oral) Active. Medications Reconciled  Social History Emeline Gins, [place]; 11/22/2018 11:10 AM) Alcohol use Occasional alcohol use. Caffeine use Carbonated beverages. No drug use Tobacco use Never smoker.  Family History Emeline Gins, [place]; 11/22/2018 11:10 AM) Colon Cancer Family Members In General. Hypertension Mother.  Other Problems Emeline Gins, CMA; 11/22/2018 11:10 AM) Back Pain Gastroesophageal Reflux Disease Hemorrhoids High blood pressure Sleep Apnea     Review of Systems Emeline Gins CMA; 11/22/2018 11:10 AM) General Not Present- Appetite Loss, Chills, Fatigue, Fever, Night Sweats,  Weight Gain and Weight Loss. Skin Not Present- Change in Wart/Mole, Dryness, Hives, Jaundice, New Lesions, Non-Healing Wounds, Rash and Ulcer. HEENT Present- Wears glasses/contact lenses. Not Present- Earache, Hearing Loss, Hoarseness, Nose Bleed, Oral Ulcers, Ringing in the Ears, Seasonal Allergies, Sinus Pain, Sore Throat, Visual Disturbances and Yellow Eyes. Respiratory Not Present- Bloody sputum, Chronic Cough, Difficulty Breathing, Snoring and Wheezing. Breast Not Present- Breast Mass, Breast Pain, Nipple Discharge and Skin Changes. Cardiovascular Not Present- Chest Pain, Difficulty Breathing Lying Down, Leg Cramps, Palpitations, Rapid Heart Rate, Shortness of Breath and Swelling of Extremities. Gastrointestinal Present- Hemorrhoids and Rectal Pain. Not Present- Abdominal Pain, Bloating, Bloody Stool, Change in Bowel Habits, Chronic diarrhea, Constipation, Difficulty Swallowing, Excessive gas, Gets full quickly at meals, Indigestion, Nausea and Vomiting. Male Genitourinary Not Present- Blood in Urine, Change in Urinary Stream, Frequency, Impotence, Nocturia, Painful Urination, Urgency and Urine Leakage. Musculoskeletal Not Present- Back Pain, Joint Pain, Joint Stiffness, Muscle Pain, Muscle Weakness and Swelling of Extremities. Neurological Not Present- Decreased Memory, Fainting, Headaches, Numbness, Seizures, Tingling, Tremor, Trouble walking and Weakness. Psychiatric Present- Change in Sleep Pattern. Not Present- Anxiety, Bipolar, Depression, Fearful and Frequent crying. Endocrine Not Present- Cold Intolerance, Excessive Hunger, Hair Changes, Heat Intolerance, Hot flashes and New Diabetes. Hematology Not Present- Blood Thinners, Easy Bruising, Excessive bleeding, Gland problems, HIV and Persistent Infections.  Vitals Emeline Gins CMA; 11/22/2018 11:11 AM) 11/22/2018 11:11 AM Weight: 235.8 lb Height: 70in Body Surface Area: 2.24 m Body Mass Index: 33.83 kg/m  Temp.: 55F   Pulse: 102 (Regular)  BP: 148/84 (Sitting, Left Arm, Standard)        Physical Exam Leighton Ruff MD; 4/54/0981 11:33 AM)  General Mental Status-Alert. General Appearance-Not in acute distress. Build & Nutrition-Well nourished. Posture-Normal posture. Gait-Normal.  Head and Neck Head-normocephalic, atraumatic with no lesions or palpable masses.  Trachea-midline.  Chest and Lung Exam Chest and lung exam reveals -on auscultation, normal breath sounds, no adventitious sounds and normal vocal resonance.  Cardiovascular Cardiovascular examination reveals -normal heart sounds, regular rate and rhythm with no murmurs and no digital clubbing, cyanosis, edema, increased warmth or tenderness.  Abdomen Inspection Inspection of the abdomen reveals - No Hernias. Palpation/Percussion Palpation and Percussion of the abdomen reveal - Soft, Non Tender, No Rigidity (guarding), No hepatosplenomegaly and No Palpable abdominal masses.  Rectal Anorectal Exam External - skin tag. Internal - normal internal exam and normal sphincter tone.  Neurologic Neurologic evaluation reveals -alert and oriented x 3 with no impairment of recent or remote memory, normal attention span and ability to concentrate, normal sensation and normal coordination.  Musculoskeletal Normal Exam - Bilateral-Upper Extremity Strength Normal and Lower Extremity Strength Normal.   Results Leighton Ruff MD; 3/53/2992 11:36 AM) Procedures  Name Value Date ANOSCOPY, DIAGNOSTIC (42683) [ Hemorrhoids ] Procedure Other: Procedure: Anoscopy....Marland KitchenMarland KitchenSurgeon: Marcello Moores....Marland KitchenMarland KitchenAfter the risks and benefits were explained, verbal consent was obtained for above procedure. A medical assistant chaperone was present throughout the entire procedure. ....Marland KitchenMarland KitchenAnesthesia: none....Marland KitchenMarland KitchenDiagnosis: Hemorrhoids....Marland KitchenMarland KitchenFindings: Large, grade 3, inflamed right posterior hemorrhoid. Grade 2 right anterior hemorrhoid.  Grade 1 left lateral hemorrhoid. No anal fissure noted. Significant anal spasm noted  Performed: 11/22/2018 11:35 AM    Assessment & Plan Leighton Ruff MD; 07/24/6220 11:38 AM)  PROLAPSED INTERNAL HEMORRHOIDS, GRADE 3 (L79.8) Impression: 48 year old male with significant pain, which appears to be due to a large grade 3 hemorrhoid in the right posterior anal canal. We discussed ways to manage this. We could perform rubber band ligation in the office. We discussed that it would take multiple bandings and that there was no guarantee that this will work as the success rate is approximately 50%. There is also a significant external component to his disease, which the rubber band would not treat. We also discussed hemorrhoid pexy which does have a faster recovery time but would also not treat his external component. It also has a higher recurrence rate. Lastly, we discussed standard hemorrhoidectomy. I told him that I think both of his right-sided hemorrhoids would be removed surgically. I do not think we would need to do anything to his left side. We discussed the typical postoperative pain and recovery time in detail. After answering all questions, the patient decided to proceed with standard hemorrhoidectomy. This will most likely be a 2 column hemorrhoidectomy. Risks include bleeding, pain, recurrence, and stenosis.

## 2018-11-22 NOTE — H&P (View-Only) (Signed)
History of Present Illness Terry Ruff MD; 12/03/9369 11:40 AM) The patient is a 48 year old male who presents with hemorrhoids. 40s. Initial male who presents to the office with approximately 5-6 years of rectal pain. He experiences occasional bleeding. He did undergo colonoscopy with Dr. Collene Mares, which showed internal hemorrhoids. He does report some prolapsing tissue with bowel movements. He has a history of chronic constipation but has increased his fiber, water intake recently. This causes him to have a bowel movement every day. He reports sharp pain with bowel movements and pain afterwards. He has been diagnosed with a fissure in the past and is taken medications for this. The patient has underwent 2 colonoscopies in the past 5 years with no sign of malignancy noted.   Past Surgical History Terry York, Terry York; 11/22/2018 11:10 AM) No pertinent past surgical history  Diagnostic Studies History Terry York, Terry York; 11/22/2018 11:10 AM) Colonoscopy within last year  Allergies Terry York, Terry York; 11/22/2018 11:11 AM) No Known Drug Allergies [11/22/2018]: Allergies Reconciled  Medication History Terry York, Terry York; 11/22/2018 11:12 AM) Lisinopril (20MG  Tablet, Oral) Active. Tadalafil (5MG  Tablet, Oral) Active. Temazepam (15MG  Capsule, Oral) Active. Medications Reconciled  Social History Terry York, Terry York; 11/22/2018 11:10 AM) Alcohol use Occasional alcohol use. Caffeine use Carbonated beverages. No drug use Tobacco use Never smoker.  Family History Terry York, Terry York; 11/22/2018 11:10 AM) Colon Cancer Family Members In General. Hypertension Mother.  Other Problems Terry York, Terry York; 11/22/2018 11:10 AM) Back Pain Gastroesophageal Reflux Disease Hemorrhoids High blood pressure Sleep Apnea     Review of Systems Terry York Terry York; 11/22/2018 11:10 AM) General Not Present- Appetite Loss, Chills, Fatigue, Fever, Night Sweats,  Weight Gain and Weight Loss. Skin Not Present- Change in Wart/Mole, Dryness, Hives, Jaundice, New Lesions, Non-Healing Wounds, Rash and Ulcer. HEENT Present- Wears glasses/contact lenses. Not Present- Earache, Hearing Loss, Hoarseness, Nose Bleed, Oral Ulcers, Ringing in the Ears, Seasonal Allergies, Sinus Pain, Sore Throat, Visual Disturbances and Yellow Eyes. Respiratory Not Present- Bloody sputum, Chronic Cough, Difficulty Breathing, Snoring and Wheezing. Breast Not Present- Breast Mass, Breast Pain, Nipple Discharge and Skin Changes. Cardiovascular Not Present- Chest Pain, Difficulty Breathing Lying Down, Leg Cramps, Palpitations, Rapid Heart Rate, Shortness of Breath and Swelling of Extremities. Gastrointestinal Present- Hemorrhoids and Rectal Pain. Not Present- Abdominal Pain, Bloating, Bloody Stool, Change in Bowel Habits, Chronic diarrhea, Constipation, Difficulty Swallowing, Excessive gas, Gets full quickly at meals, Indigestion, Nausea and Vomiting. Male Genitourinary Not Present- Blood in Urine, Change in Urinary Stream, Frequency, Impotence, Nocturia, Painful Urination, Urgency and Urine Leakage. Musculoskeletal Not Present- Back Pain, Joint Pain, Joint Stiffness, Muscle Pain, Muscle Weakness and Swelling of Extremities. Neurological Not Present- Decreased Memory, Fainting, Headaches, Numbness, Seizures, Tingling, Tremor, Trouble walking and Weakness. Psychiatric Present- Change in Sleep Pattern. Not Present- Anxiety, Bipolar, Depression, Fearful and Frequent crying. Endocrine Not Present- Cold Intolerance, Excessive Hunger, Hair Changes, Heat Intolerance, Hot flashes and New Diabetes. Hematology Not Present- Blood Thinners, Easy Bruising, Excessive bleeding, Gland problems, HIV and Persistent Infections.  Vitals Terry York Terry York; 11/22/2018 11:11 AM) 11/22/2018 11:11 AM Weight: 235.8 lb Height: 70in Body Surface Area: 2.24 m Body Mass Index: 33.83 kg/m  Temp.: 46F   Pulse: 102 (Regular)  BP: 148/84 (Sitting, Left Arm, Standard)        Physical Exam Terry Ruff MD; 6/96/7893 11:33 AM)  General Mental Status-Alert. General Appearance-Not in acute distress. Build & Nutrition-Well nourished. Posture-Normal posture. Gait-Normal.  Head and Neck Head-normocephalic, atraumatic with no lesions or palpable masses.  Trachea-midline.  Chest and Lung Exam Chest and lung exam reveals -on auscultation, normal breath sounds, no adventitious sounds and normal vocal resonance.  Cardiovascular Cardiovascular examination reveals -normal heart sounds, regular rate and rhythm with no murmurs and no digital clubbing, cyanosis, edema, increased warmth or tenderness.  Abdomen Inspection Inspection of the abdomen reveals - No Hernias. Palpation/Percussion Palpation and Percussion of the abdomen reveal - Soft, Non Tender, No Rigidity (guarding), No hepatosplenomegaly and No Palpable abdominal masses.  Rectal Anorectal Exam External - skin tag. Internal - normal internal exam and normal sphincter tone.  Neurologic Neurologic evaluation reveals -alert and oriented x 3 with no impairment of recent or remote memory, normal attention span and ability to concentrate, normal sensation and normal coordination.  Musculoskeletal Normal Exam - Bilateral-Upper Extremity Strength Normal and Lower Extremity Strength Normal.   Results Terry Ruff MD; 4/91/7915 11:36 AM) Procedures  Name Value Date ANOSCOPY, DIAGNOSTIC (05697) [ Hemorrhoids ] Procedure Other: Procedure: Anoscopy....Marland KitchenMarland KitchenSurgeon: Marcello Moores....Marland KitchenMarland KitchenAfter the risks and benefits were explained, verbal consent was obtained for above procedure. A medical assistant chaperone was present throughout the entire procedure. ....Marland KitchenMarland KitchenAnesthesia: none....Marland KitchenMarland KitchenDiagnosis: Hemorrhoids....Marland KitchenMarland KitchenFindings: Large, grade 3, inflamed right posterior hemorrhoid. Grade 2 right anterior hemorrhoid.  Grade 1 left lateral hemorrhoid. No anal fissure noted. Significant anal spasm noted  Performed: 11/22/2018 11:35 AM    Assessment & Plan Terry Ruff MD; 9/48/0165 11:38 AM)  PROLAPSED INTERNAL HEMORRHOIDS, GRADE 3 (V37.4) Impression: 48 year old male with significant pain, which appears to be due to a large grade 3 hemorrhoid in the right posterior anal canal. We discussed ways to manage this. We could perform rubber band ligation in the office. We discussed that it would take multiple bandings and that there was no guarantee that this will work as the success rate is approximately 50%. There is also a significant external component to his disease, which the rubber band would not treat. We also discussed hemorrhoid pexy which does have a faster recovery time but would also not treat his external component. It also has a higher recurrence rate. Lastly, we discussed standard hemorrhoidectomy. I told him that I think both of his right-sided hemorrhoids would be removed surgically. I do not think we would need to do anything to his left side. We discussed the typical postoperative pain and recovery time in detail. After answering all questions, the patient decided to proceed with standard hemorrhoidectomy. This will most likely be a 2 column hemorrhoidectomy. Risks include bleeding, pain, recurrence, and stenosis.

## 2018-11-30 ENCOUNTER — Other Ambulatory Visit (HOSPITAL_COMMUNITY)
Admission: RE | Admit: 2018-11-30 | Discharge: 2018-11-30 | Disposition: A | Discharge status: Home / Self Care | Payer: BC Managed Care – PPO | Source: Ambulatory Visit | Attending: General Surgery | Admitting: General Surgery

## 2018-11-30 DIAGNOSIS — Z20828 Contact with and (suspected) exposure to other viral communicable diseases: Secondary | ICD-10-CM | POA: Diagnosis not present

## 2018-11-30 DIAGNOSIS — Z01812 Encounter for preprocedural laboratory examination: Secondary | ICD-10-CM | POA: Diagnosis present

## 2018-11-30 LAB — SARS CORONAVIRUS 2 (TAT 6-24 HRS): SARS Coronavirus 2: NEGATIVE

## 2018-12-02 ENCOUNTER — Other Ambulatory Visit: Payer: Self-pay

## 2018-12-02 ENCOUNTER — Encounter (HOSPITAL_BASED_OUTPATIENT_CLINIC_OR_DEPARTMENT_OTHER): Payer: Self-pay | Admitting: *Deleted

## 2018-12-02 NOTE — Progress Notes (Signed)
SPOKE WITH [facility]. NPO AFTER MIDNIGHT, ARRIVE 730 AM PER DR [facility] 12-03-18 [facility]. NO MEDS TO TAKE, BRING CPAP, MASK TUBING AND MACHINE. NEEDS ISTAT 4 AND EKG.HAS SURGERY ORDERS IN Epic. DRIVER WIFE MOROLAKE CELL [phone].

## 2018-12-03 ENCOUNTER — Ambulatory Visit (HOSPITAL_BASED_OUTPATIENT_CLINIC_OR_DEPARTMENT_OTHER): Payer: BC Managed Care – PPO | Admitting: Anesthesiology

## 2018-12-03 ENCOUNTER — Other Ambulatory Visit: Payer: Self-pay

## 2018-12-03 ENCOUNTER — Ambulatory Visit (HOSPITAL_BASED_OUTPATIENT_CLINIC_OR_DEPARTMENT_OTHER)
Admission: RE | Admit: 2018-12-03 | Discharge: 2018-12-03 | Disposition: A | Discharge status: Home / Self Care | Payer: BC Managed Care – PPO | Attending: General Surgery | Admitting: General Surgery

## 2018-12-03 ENCOUNTER — Encounter (HOSPITAL_BASED_OUTPATIENT_CLINIC_OR_DEPARTMENT_OTHER): Payer: Self-pay | Admitting: *Deleted

## 2018-12-03 ENCOUNTER — Encounter (HOSPITAL_BASED_OUTPATIENT_CLINIC_OR_DEPARTMENT_OTHER)
Admission: RE | Disposition: A | Discharge status: Home / Self Care | Payer: Self-pay | Source: Home / Self Care | Attending: General Surgery

## 2018-12-03 DIAGNOSIS — E669 Obesity, unspecified: Secondary | ICD-10-CM | POA: Insufficient documentation

## 2018-12-03 DIAGNOSIS — I10 Essential (primary) hypertension: Secondary | ICD-10-CM | POA: Diagnosis not present

## 2018-12-03 DIAGNOSIS — Z6833 Body mass index (BMI) 33.0-33.9, adult: Secondary | ICD-10-CM | POA: Diagnosis not present

## 2018-12-03 DIAGNOSIS — Z79899 Other long term (current) drug therapy: Secondary | ICD-10-CM | POA: Insufficient documentation

## 2018-12-03 DIAGNOSIS — K642 Third degree hemorrhoids: Secondary | ICD-10-CM | POA: Insufficient documentation

## 2018-12-03 DIAGNOSIS — G4730 Sleep apnea, unspecified: Secondary | ICD-10-CM | POA: Insufficient documentation

## 2018-12-03 HISTORY — PX: HEMORRHOID SURGERY: SHX153

## 2018-12-03 HISTORY — DX: Unspecified convulsions: R56.9

## 2018-12-03 LAB — POCT I-STAT, CHEM 8
BUN: 12 mg/dL (ref 6–20)
Calcium, Ion: 1.27 mmol/L (ref 1.15–1.40)
Chloride: 102 mmol/L (ref 98–111)
Creatinine, Ser: 1.4 mg/dL — ABNORMAL HIGH (ref 0.61–1.24)
Glucose, Bld: 118 mg/dL — ABNORMAL HIGH (ref 70–99)
HCT: 39 % (ref 39.0–52.0)
Hemoglobin: 13.3 g/dL (ref 13.0–17.0)
Potassium: 4.3 mmol/L (ref 3.5–5.1)
Sodium: 140 mmol/L (ref 135–145)
TCO2: 25 mmol/L (ref 22–32)

## 2018-12-03 SURGERY — HEMORRHOIDECTOMY
Anesthesia: Monitor Anesthesia Care

## 2018-12-03 MED ORDER — PROPOFOL 500 MG/50ML IV EMUL
INTRAVENOUS | Status: AC
  Filled 2018-12-03: qty 50

## 2018-12-03 MED ORDER — LIDOCAINE 5 % EX OINT
TOPICAL_OINTMENT | CUTANEOUS | Status: DC | PRN
  Administered 2018-12-03: 1 via TOPICAL

## 2018-12-03 MED ORDER — MIDAZOLAM HCL 2 MG/2ML IJ SOLN
0.5000 mg | Freq: Once | INTRAMUSCULAR | Status: DC | PRN
  Filled 2018-12-03: qty 2

## 2018-12-03 MED ORDER — LACTATED RINGERS IV SOLN
INTRAVENOUS | Status: DC
  Administered 2018-12-03: 09:00:00 via INTRAVENOUS
  Filled 2018-12-03: qty 1000

## 2018-12-03 MED ORDER — FENTANYL CITRATE (PF) 100 MCG/2ML IJ SOLN
25.0000 ug | INTRAMUSCULAR | Status: DC | PRN
  Filled 2018-12-03: qty 1

## 2018-12-03 MED ORDER — LIDOCAINE HCL (CARDIAC) PF 100 MG/5ML IV SOSY
PREFILLED_SYRINGE | INTRAVENOUS | Status: DC | PRN
  Administered 2018-12-03: 60 mg via INTRAVENOUS

## 2018-12-03 MED ORDER — PROMETHAZINE HCL 25 MG/ML IJ SOLN
6.2500 mg | INTRAMUSCULAR | Status: DC | PRN
  Filled 2018-12-03: qty 1

## 2018-12-03 MED ORDER — MIDAZOLAM HCL 2 MG/2ML IJ SOLN
INTRAMUSCULAR | Status: AC
  Filled 2018-12-03: qty 2

## 2018-12-03 MED ORDER — LIDOCAINE 2% (20 MG/ML) 5 ML SYRINGE
INTRAMUSCULAR | Status: AC
  Filled 2018-12-03: qty 5

## 2018-12-03 MED ORDER — DEXAMETHASONE SODIUM PHOSPHATE 4 MG/ML IJ SOLN
INTRAMUSCULAR | Status: DC | PRN
  Administered 2018-12-03: 5 mg via INTRAVENOUS

## 2018-12-03 MED ORDER — CELECOXIB 200 MG PO CAPS
ORAL_CAPSULE | ORAL | Status: AC
  Filled 2018-12-03: qty 1

## 2018-12-03 MED ORDER — ACETAMINOPHEN 500 MG PO TABS
ORAL_TABLET | ORAL | Status: AC
  Filled 2018-12-03: qty 2

## 2018-12-03 MED ORDER — BUPIVACAINE LIPOSOME 1.3 % IJ SUSP
INTRAMUSCULAR | Status: DC | PRN
  Administered 2018-12-03: 20 mL

## 2018-12-03 MED ORDER — DIAZEPAM 5 MG PO TABS: 5.0000 mg | ORAL_TABLET | Freq: Three times a day (TID) | ORAL | 0 refills | Status: AC | PRN

## 2018-12-03 MED ORDER — PROPOFOL 500 MG/50ML IV EMUL
INTRAVENOUS | Status: DC | PRN
  Administered 2018-12-03: 50 ug/kg/min via INTRAVENOUS

## 2018-12-03 MED ORDER — FENTANYL CITRATE (PF) 100 MCG/2ML IJ SOLN
INTRAMUSCULAR | Status: DC | PRN
  Administered 2018-12-03 (×4): 25 ug via INTRAVENOUS

## 2018-12-03 MED ORDER — SODIUM CHLORIDE 0.9% FLUSH
3.0000 mL | Freq: Two times a day (BID) | INTRAVENOUS | Status: DC
  Filled 2018-12-03: qty 3

## 2018-12-03 MED ORDER — MIDAZOLAM HCL 5 MG/5ML IJ SOLN
INTRAMUSCULAR | Status: DC | PRN
  Administered 2018-12-03: 1 mg via INTRAVENOUS
  Administered 2018-12-03: 2 mg via INTRAVENOUS
  Administered 2018-12-03: 1 mg via INTRAVENOUS

## 2018-12-03 MED ORDER — CELECOXIB 200 MG PO CAPS
200.0000 mg | ORAL_CAPSULE | ORAL | Status: AC
  Administered 2018-12-03: 09:00:00 200 mg via ORAL
  Filled 2018-12-03: qty 1

## 2018-12-03 MED ORDER — MEPERIDINE HCL 25 MG/ML IJ SOLN
6.2500 mg | INTRAMUSCULAR | Status: DC | PRN
  Filled 2018-12-03: qty 1

## 2018-12-03 MED ORDER — BUPIVACAINE-EPINEPHRINE 0.5% -1:200000 IJ SOLN
INTRAMUSCULAR | Status: DC | PRN
  Administered 2018-12-03: 30 mL

## 2018-12-03 MED ORDER — ACETAMINOPHEN 500 MG PO TABS
1000.0000 mg | ORAL_TABLET | ORAL | Status: AC
  Administered 2018-12-03: 09:00:00 1000 mg via ORAL
  Filled 2018-12-03: qty 2

## 2018-12-03 MED ORDER — KETOROLAC TROMETHAMINE 30 MG/ML IJ SOLN
INTRAMUSCULAR | Status: DC | PRN
  Administered 2018-12-03: 30 mg via INTRAVENOUS

## 2018-12-03 MED ORDER — BUPIVACAINE LIPOSOME 1.3 % IJ SUSP
20.0000 mL | Freq: Once | INTRAMUSCULAR | Status: DC
  Filled 2018-12-03: qty 20

## 2018-12-03 MED ORDER — GABAPENTIN 300 MG PO CAPS
300.0000 mg | ORAL_CAPSULE | ORAL | Status: AC
  Administered 2018-12-03: 09:00:00 300 mg via ORAL
  Filled 2018-12-03: qty 1

## 2018-12-03 MED ORDER — GABAPENTIN 300 MG PO CAPS
ORAL_CAPSULE | ORAL | Status: AC
  Filled 2018-12-03: qty 1

## 2018-12-03 MED ORDER — OXYCODONE HCL 5 MG PO TABS: 5.0000 mg | ORAL_TABLET | Freq: Four times a day (QID) | ORAL | 0 refills | Status: DC | PRN

## 2018-12-03 MED ORDER — ONDANSETRON HCL 4 MG/2ML IJ SOLN
INTRAMUSCULAR | Status: DC | PRN
  Administered 2018-12-03: 4 mg via INTRAVENOUS

## 2018-12-03 MED ORDER — ONDANSETRON HCL 4 MG/2ML IJ SOLN
INTRAMUSCULAR | Status: AC
  Filled 2018-12-03: qty 2

## 2018-12-03 MED ORDER — FENTANYL CITRATE (PF) 100 MCG/2ML IJ SOLN
INTRAMUSCULAR | Status: AC
  Filled 2018-12-03: qty 2

## 2018-12-03 MED ORDER — DEXAMETHASONE SODIUM PHOSPHATE 10 MG/ML IJ SOLN
INTRAMUSCULAR | Status: AC
  Filled 2018-12-03: qty 1

## 2018-12-03 SURGICAL SUPPLY — 41 items
BLADE EXTENDED COATED 6.5IN (ELECTRODE) IMPLANT
BLADE HEX COATED 2.75 (ELECTRODE) ×3 IMPLANT
BLADE SURG 10 STRL SS (BLADE) IMPLANT
BRIEF STRETCH FOR OB PAD LRG (UNDERPADS AND DIAPERS) IMPLANT
COVER BACK TABLE 60X90IN (DRAPES) ×3 IMPLANT
COVER MAYO STAND STRL (DRAPES) ×3 IMPLANT
COVER WAND RF STERILE (DRAPES) ×6 IMPLANT
DRAPE LAPAROTOMY 100X72 PEDS (DRAPES) ×3 IMPLANT
DRAPE UTILITY XL STRL (DRAPES) ×3 IMPLANT
DRSG PAD ABDOMINAL 8X10 ST (GAUZE/BANDAGES/DRESSINGS) ×2 IMPLANT
ELECT REM PT RETURN 9FT ADLT (ELECTROSURGICAL) ×3
ELECTRODE REM PT RTRN 9FT ADLT (ELECTROSURGICAL) ×1 IMPLANT
GAUZE SPONGE 4X4 12PLY STRL (GAUZE/BANDAGES/DRESSINGS) ×2 IMPLANT
GLOVE BIO SURGEON STRL SZ 6.5 (GLOVE) ×2 IMPLANT
GLOVE BIO SURGEONS STRL SZ 6.5 (GLOVE) ×1
GLOVE BIOGEL PI IND STRL 7.0 (GLOVE) ×1 IMPLANT
GLOVE BIOGEL PI INDICATOR 7.0 (GLOVE) ×2
GOWN STRL REUS W/TWL 2XL LVL3 (GOWN DISPOSABLE) ×3 IMPLANT
KIT SIGMOIDOSCOPE (SET/KITS/TRAYS/PACK) IMPLANT
KIT TURNOVER CYSTO (KITS) ×3 IMPLANT
NEEDLE HYPO 22GX1.5 SAFETY (NEEDLE) ×3 IMPLANT
NS IRRIG 500ML POUR BTL (IV SOLUTION) ×3 IMPLANT
PACK BASIN DAY SURGERY FS (CUSTOM PROCEDURE TRAY) ×3 IMPLANT
PAD ABD 8X10 STRL (GAUZE/BANDAGES/DRESSINGS) ×3 IMPLANT
PAD ARMBOARD 7.5X6 YLW CONV (MISCELLANEOUS) IMPLANT
PENCIL BUTTON HOLSTER BLD 10FT (ELECTRODE) ×3 IMPLANT
SPONGE SURGIFOAM ABS GEL 100 (HEMOSTASIS) IMPLANT
SPONGE SURGIFOAM ABS GEL 12-7 (HEMOSTASIS) IMPLANT
SUT CHROMIC 2 0 SH (SUTURE) IMPLANT
SUT CHROMIC 3 0 SH 27 (SUTURE) IMPLANT
SUT VIC AB 2-0 SH 27 (SUTURE)
SUT VIC AB 2-0 SH 27XBRD (SUTURE) IMPLANT
SUT VIC AB 3-0 SH 27 (SUTURE) ×2
SUT VIC AB 3-0 SH 27X BRD (SUTURE) IMPLANT
SUT VIC AB 4-0 SH 18 (SUTURE) IMPLANT
SYR CONTROL 10ML LL (SYRINGE) ×3 IMPLANT
TRAY DSU PREP LF (CUSTOM PROCEDURE TRAY) ×3 IMPLANT
TUBE CONNECTING 12'X1/4 (SUCTIONS) ×1
TUBE CONNECTING 12X1/4 (SUCTIONS) ×2 IMPLANT
WATER STERILE IRR 500ML POUR (IV SOLUTION) IMPLANT
YANKAUER SUCT BULB TIP NO VENT (SUCTIONS) ×3 IMPLANT

## 2018-12-03 NOTE — Anesthesia Postprocedure Evaluation (Signed)
Anesthesia Post Note  Patient: Terry York  Procedure(s) Performed: HEMORRHOIDECTOMY (2ND COLUMN) (N/A )     Patient location during evaluation: Phase II Anesthesia Type: MAC Level of consciousness: awake and alert, patient cooperative and oriented Pain management: pain level controlled Vital Signs Assessment: post-procedure vital signs reviewed and stable Respiratory status: spontaneous breathing, nonlabored ventilation and respiratory function stable Cardiovascular status: blood pressure returned to baseline and stable Postop Assessment: no apparent nausea or vomiting and adequate PO intake Anesthetic complications: no    Last Vitals:  Vitals:   12/03/18 1200 12/03/18 1238  BP: (!) 124/93 (!) 137/95  Pulse: 76 76  Resp: 16 16  Temp:  (!) 36.4 C  SpO2: 99% 100%    Last Pain:  Vitals:   12/03/18 1238  TempSrc:   PainSc: 0-No pain                 Taos Tapp,E. Enslie Sahota

## 2018-12-03 NOTE — Interval H&P Note (Signed)
History and Physical Interval Note:  12/03/2018 8:12 AM  Terry York  has presented today for surgery, with the diagnosis of GRADE 3 INTERNAL HEMORRHOID.  The various methods of treatment have been discussed with the patient and family. After consideration of risks, benefits and other options for treatment, the patient has consented to  Procedure(s): HEMORRHOIDECTOMY (2ND COLUMN) (N/A) as a surgical intervention.  The patient's history has been reviewed, patient examined, no change in status, stable for surgery.  I have reviewed the patient's chart and labs.  Questions were answered to the patient's satisfaction.     Rosario Adie, MD  [facility]

## 2018-12-03 NOTE — Anesthesia Preprocedure Evaluation (Addendum)
Anesthesia Evaluation  Patient identified by MRN, date of birth, ID band Patient awake    Reviewed: Allergy & Precautions, NPO status , Patient's Chart, lab work & pertinent test results  History of Anesthesia Complications Negative for: history of anesthetic complications  Airway Mallampati: II  TM Distance: >3 FB Neck ROM: Full    Dental  (+) Dental Advisory Given   Pulmonary sleep apnea and Continuous Positive Airway Pressure Ventilation ,  11/30/2018 SARS coronavirus NEG   breath sounds clear to auscultation       Cardiovascular hypertension, Pt. on medications (-) angina Rhythm:Regular Rate:Normal     Neuro/Psych negative neurological ROS     GI/Hepatic negative GI ROS, Neg liver ROS,   Endo/Other  obese  Renal/GU negative Renal ROS     Musculoskeletal   Abdominal (+) + obese,   Peds  Hematology negative hematology ROS (+)   Anesthesia Other Findings   Reproductive/Obstetrics                            Anesthesia Physical Anesthesia Plan  ASA: III  Anesthesia Plan: MAC   Post-op Pain Management:    Induction:   PONV Risk Score and Plan: 1 and Ondansetron  Airway Management Planned: Natural Airway and Simple Face Mask  Additional Equipment:   Intra-op Plan:   Post-operative Plan:   Informed Consent: I have reviewed the patients History and Physical, chart, labs and discussed the procedure including the risks, benefits and alternatives for the proposed anesthesia with the patient or authorized representative who has indicated his/her understanding and acceptance.     Dental advisory given  Plan Discussed with: CRNA and Surgeon  Anesthesia Plan Comments:         Anesthesia Quick Evaluation

## 2018-12-03 NOTE — Anesthesia Procedure Notes (Signed)
Procedure Name: MAC Date/Time: 12/03/2018 11:00 AM Performed by: Justice Rocher, CRNA Pre-anesthesia Checklist: Patient identified, Emergency Drugs available, Suction available, Patient being monitored and Timeout performed Patient Re-evaluated:Patient Re-evaluated prior to induction Oxygen Delivery Method: Simple face mask Preoxygenation: Pre-oxygenation with 100% oxygen Induction Type: IV induction Placement Confirmation: positive ETCO2 and breath sounds checked- equal and bilateral

## 2018-12-03 NOTE — Op Note (Signed)
12/03/2018  11:21 AM  PATIENT:  Terry York  48 y.o. male  Patient Care Team: Lin Landsman, MD as PCP - General (Family Medicine)  PRE-OPERATIVE DIAGNOSIS:  GRADE 3 INTERNAL HEMORRHOID  POST-OPERATIVE DIAGNOSIS:  GRADE 3 INTERNAL HEMORRHOID  PROCEDURE:  SINGLE COLUMN HEMORRHOIDECTOMY with HEMORRHOIDOPEXY   Surgeon(s): Leighton Ruff, MD  ASSISTANT: none   ANESTHESIA:   local and MAC  SPECIMEN:  Source of Specimen:  R posterior internal hemorrhoid  DISPOSITION OF SPECIMEN:  PATHOLOGY  COUNTS:  YES  PLAN OF CARE: Discharge to home after PACU  PATIENT DISPOSITION:  PACU - hemodynamically stable.  INDICATION: 48 y.o. M with Grade 3 internal hemorrhoid causing significant bleeding and pain   OR FINDINGS: Grade 3 right posterior hemorrhoid with large internal component  DESCRIPTION: the patient was identified in the preoperative holding area and taken to the OR where they were laid on the operating room table.  MAC anesthesia was induced without difficulty. The patient was then positioned in prone jackknife position with buttocks gently taped apart.  The patient was then prepped and draped in usual sterile fashion.  SCDs were noted to be in place prior to the initiation of anesthesia. A surgical timeout was performed indicating the correct patient, procedure, positioning and need for preoperative antibiotics.  A rectal block was performed using Marcaine with epinephrine mixed with Exparel.    I began with a digital rectal exam.  The patient had good sphincter tone.  I then placed a Hill-Ferguson anoscope into the anal canal and evaluated this completely.  The patient had a large grade 3 right posterior hemorrhoid with wide internal component.  There was minimal disease noted at right anterior and left lateral positions.  I began by elevating the hemorrhoid using an Allis clamp.  I then used Metzenbaum scissors to divide down to the level of the sphincter complex.  I bluntly dissected  the hemorrhoidal tissue off of the sphincter complex and then divided the remaining anoderm of the internal hemorrhoid component.  After the specimen was removed the internal component was closed using a running 2-0 chromic suture.  There still appeared to be some hemorrhoidal tissue internally towards the posterior midline.  I decided to perform a hemorrhoidal pexy with this tissue.  This was done using a 3-0 Vicryl suture.  I then closed the external component of the hemorrhoidectomy with a running 3-0 chromic suture.  Hemostasis was achieved using direct pressure.  Once this was completed, the remaining Exparel and Marcaine mixture was placed around the hemorrhoid site.  Lidocaine ointment and a sterile dressing was applied.  The patient was then awakened from anesthesia and sent to the postanesthesia care unit in stable condition.  All counts were correct per operating room staff.

## 2018-12-03 NOTE — Discharge Instructions (Addendum)
ANORECTAL SURGERY: POST OP INSTRUCTIONS 1. Take your usually prescribed home medications unless otherwise directed. 2. DIET: During the first few hours after surgery sip on some liquids until you are able to urinate.  It is normal to not urinate for several hours after this surgery.  If you feel uncomfortable, please contact the office for instructions.  After you are able to urinate,you may eat, if you feel like it.  Follow a light bland diet the first 24 hours after arrival home, such as soup, liquids, crackers, etc.  Be sure to include lots of fluids daily (6-8 glasses).  Avoid fast food or heavy meals, as your are more likely to get nauseated.  Eat a low fat diet the next few days after surgery.  Limit caffeine intake to 1-2 servings a day. 3. PAIN CONTROL: a. Pain is best controlled by a usual combination of several different methods TOGETHER: i. Muscle relaxation 1.  Soak in a warm bath (or Sitz bath) three times a day and after bowel movements.  Continue to do this until all pain is resolved. 2. Take the muscle relaxer (Valium) every 6 hours for the first 2 days after surgery          3. Over the counter pain medication/   Motrin,Aleve start after 5 pm -Tylenol may use after 2 pm ii. Prescription pain medication b. Most patients will experience some swelling and discomfort in the anus/rectal area and incisions.  Heat such as warm towels, sitz baths, warm baths, etc to help relax tight/sore spots and speed recovery.  Some people prefer to use ice, especially in the first couple days after surgery, as it may decrease the pain and swelling, or alternate between ice & heat.  Experiment to what works for you.  Swelling and bruising can take several weeks to resolve.  Pain can take even longer to completely resolve. c. It is helpful to take an over-the-counter pain medication regularly for the first few weeks.  Choose one of the following that works best for you: i. Naproxen (Aleve, etc)  Two 220mg   tabs twice a day ii. Ibuprofen (Advil, etc) Three 200mg  tabs four times a day (every meal & bedtime) d. A  prescription for pain medication (such as percocet, oxycodone, hydrocodone, etc) should be given to you upon discharge.  Take your pain medication as prescribed.  i. If you are having problems/concerns with the prescription medicine (does not control pain, nausea, vomiting, rash, itching, etc), please call us [phone] to see if we need to switch you to a different pain medicine that will work better for you and/or control your side effect better. ii. If you need a refill on your pain medication, please contact your pharmacy.  They will contact our office to request authorization. Prescriptions will not be filled after 5 pm or on week-ends. 4. KEEP YOUR BOWELS REGULAR and AVOID CONSTIPATION a. The goal is one to two soft bowel movements a day.  You should at least have a bowel movement every other day. b. Avoid getting constipated.  Between the surgery and the pain medications, it is common to experience some constipation. This can be very painful after rectal surgery.  Increasing fluid intake and taking a fiber supplement (such as Metamucil, Citrucel, FiberCon, etc) 1-2 times a day regularly will usually help prevent this problem from occurring.  A stool softener like colace is also recommended.  This can be purchased over the counter at your pharmacy.  You can take it  up to 3 times a day.  If you do not have a bowel movement after 24 hrs since your surgery, take one does of milk of magnesia.  If you still haven't had a bowel movement 8-12 hours after that dose, take another dose.  If you don't have a bowel movement 48 hrs after surgery, purchase a Fleets enema from the drug store and administer gently per package instructions.  If you still are having trouble with your bowel movements after that, please call the office for further instructions. c. If you develop diarrhea or have many loose bowel  movements, simplify your diet to bland foods & liquids for a few days.  Stop any stool softeners and decrease your fiber supplement.  Switching to mild anti-diarrheal medications (Kayopectate, Pepto Bismol) can help.  If this worsens or does not improve, please call us.  5. Wound Care a. Remove your bandages before your first bowel movement or 8 hours after surgery.     b. Remove any wound packing material at this tim,e as well.  You do not need to repack the wound unless instructed otherwise.  Wear an absorbent pad or soft cotton gauze in your underwear to catch any drainage and help keep the area clean. You should change this every 2-3 hours while awake. c. Keep the area clean and dry.  Bathe / shower every day, especially after bowel movements.  Keep the area clean by showering / bathing over the incision / wound.   It is okay to soak an open wound to help wash it.  Wet wipes or showers / gentle washing after bowel movements is often less traumatic than regular toilet paper. d. Dennis Bast may have some styrofoam-like soft packing in the rectum which will come out with the first bowel movement.  e. You will often notice bleeding with bowel movements.  This should slow down by the end of the first week of surgery f. Expect some drainage.  This should slow down, too, by the end of the first week of surgery.  Wear an absorbent pad or soft cotton gauze in your underwear until the drainage stops. g. Do Not sit on a rubber or pillow ring.  This can make you symptoms worse.  You may sit on a soft pillow if needed.  6. ACTIVITIES as tolerated:   a. You may resume regular (light) daily activities beginning the next day--such as daily self-care, walking, climbing stairs--gradually increasing activities as tolerated.  If you can walk 30 minutes without difficulty, it is safe to try more intense activity such as jogging, treadmill, bicycling, low-impact aerobics, swimming, etc. b. Save the most intensive and strenuous  activity for last such as sit-ups, heavy lifting, contact sports, etc  Refrain from any heavy lifting or straining until you are off narcotics for pain control.   c. You may drive when you are no longer taking prescription pain medication, you can comfortably sit for long periods of time, and you can safely maneuver your car and apply brakes. d. Dennis Bast may have sexual intercourse when it is comfortable.  7. FOLLOW UP in our office a. Please call [facility] at [phone] to set up an appointment to see your surgeon in the office for a follow-up appointment approximately 3-4 weeks after your surgery. b. Make sure that you call for this appointment the day you arrive home to insure a convenient appointment time. 10. IF YOU HAVE DISABILITY OR FAMILY LEAVE FORMS, BRING THEM TO THE OFFICE FOR PROCESSING.  DO NOT GIVE THEM TO YOUR DOCTOR.     WHEN TO CALL us [phone]: 1. Poor pain control 2. Reactions / problems with new medications (rash/itching, nausea, etc)  3. Fever over 101.5 F (38.5 C) 4. Inability to urinate 5. Nausea and/or vomiting 6. Worsening swelling or bruising 7. Continued bleeding from incision. 8. Increased pain, redness, or drainage from the incision  The clinic staff is available to answer your questions during regular business hours (8:30am-5pm).  Please dont hesitate to call and ask to speak to one of our nurses for clinical concerns.   A surgeon from [facility] is always on call at the hospitals   If you have a medical emergency, go to the nearest emergency room or call 911.    [facility], [place], [place], [place], [place]  [address] ? MAIN: [phone] ? TOLL FREE: [phone] ? FAX [phone] [email or web address]   Post Anesthesia Home Care Instructions  Activity: Get plenty of rest for the remainder of the day. A responsible individual must stay with you for 24 hours following the procedure.  For  the next 24 hours, DO NOT: -Drive a car -Paediatric nurse -Drink alcoholic beverages -Take any medication unless instructed by your physician -Make any legal decisions or sign important papers.  Meals: Start with liquid foods such as gelatin or soup. Progress to regular foods as tolerated. Avoid greasy, spicy, heavy foods. If nausea and/or vomiting occur, drink only clear liquids until the nausea and/or vomiting subsides. Call your physician if vomiting continues.  Special Instructions/Symptoms: Your throat may feel dry or sore from the anesthesia or the breathing tube placed in your throat during surgery. If this causes discomfort, gargle with warm salt water. The discomfort should disappear within 24 hours.  If you had a scopolamine patch placed behind your ear for the management of post- operative nausea and/or vomiting:  1. The medication in the patch is effective for 72 hours, after which it should be removed.  Wrap patch in a tissue and discard in the trash. Wash hands thoroughly with soap and water. 2. You may remove the patch earlier than 72 hours if you experience unpleasant side effects which may include dry mouth, dizziness or visual disturbances. 3. Avoid touching the patch. Wash your hands with soap and water after contact with the patch.    Information for Discharge Teaching: EXPAREL (bupivacaine liposome injectable suspension)   Your surgeon or anesthesiologist gave you EXPAREL(bupivacaine) to help control your pain after surgery.   EXPAREL is a local anesthetic that provides pain relief by numbing the tissue around the surgical site.  EXPAREL is designed to release pain medication over time and can control pain for up to 72 hours.  Depending on how you respond to EXPAREL, you may require less pain medication during your recovery.  Possible side effects:  Temporary loss of sensation or ability to move in the area where bupivacaine was injected.  Nausea, vomiting,  constipation  Rarely, numbness and tingling in your mouth or lips, lightheadedness, or anxiety may occur.  Call your doctor right away if you think you may be experiencing any of these sensations, or if you have other questions regarding possible side effects.  Follow all other discharge instructions given to you by your surgeon or nurse. Eat a healthy diet and drink plenty of water or other fluids.  If you return to the hospital for any reason within 96 hours following the administration of EXPAREL,  it is important for health care providers to know that you have received this anesthetic. A teal colored band has been placed on your arm with the date, time and amount of EXPAREL you have received in order to alert and inform your health care providers. Please leave this armband in place for the full 96 hours following administration, and then you may remove the band.

## 2018-12-03 NOTE — Transfer of Care (Signed)
Immediate Anesthesia Transfer of Care Note  Patient: Terry York  Procedure(s) Performed: Procedure(s) (LRB): HEMORRHOIDECTOMY (2ND COLUMN) (N/A)  Patient Location: PACU  Anesthesia Type: MAC  Level of Consciousness: awake, sedated, patient cooperative and responds to stimulation  Airway & Oxygen Therapy: Patient Spontanous Breathing and Patient connected to face mask oxygen  Post-op Assessment: Report given to PACU RN, Post -op Vital signs reviewed and stable and Patient moving all extremities  Post vital signs: Reviewed and stable  Complications: No apparent anesthesia complications

## 2018-12-06 ENCOUNTER — Encounter (HOSPITAL_BASED_OUTPATIENT_CLINIC_OR_DEPARTMENT_OTHER): Payer: Self-pay | Admitting: General Surgery

## 2019-09-22 ENCOUNTER — Other Ambulatory Visit: Payer: Self-pay | Admitting: Family Medicine

## 2019-09-22 DIAGNOSIS — R29898 Other symptoms and signs involving the musculoskeletal system: Secondary | ICD-10-CM

## 2019-09-22 DIAGNOSIS — R1310 Dysphagia, unspecified: Secondary | ICD-10-CM

## 2019-10-04 ENCOUNTER — Other Ambulatory Visit: Payer: Self-pay | Admitting: Family Medicine

## 2019-10-07 ENCOUNTER — Ambulatory Visit
Admission: RE | Admit: 2019-10-07 | Discharge: 2019-10-07 | Disposition: A | Discharge status: Home / Self Care | Payer: BLUE CROSS/BLUE SHIELD | Source: Ambulatory Visit | Attending: Family Medicine | Admitting: Family Medicine

## 2019-10-07 DIAGNOSIS — R29898 Other symptoms and signs involving the musculoskeletal system: Secondary | ICD-10-CM

## 2019-10-07 DIAGNOSIS — R1310 Dysphagia, unspecified: Secondary | ICD-10-CM

## 2019-10-07 MED ORDER — IOPAMIDOL (ISOVUE-300) INJECTION 61%
75.0000 mL | Freq: Once | INTRAVENOUS | Status: AC | PRN
  Administered 2019-10-07: 75 mL via INTRAVENOUS

## 2020-03-15 ENCOUNTER — Ambulatory Visit: Payer: BLUE CROSS/BLUE SHIELD | Admitting: Sports Medicine

## 2020-03-15 ENCOUNTER — Encounter: Payer: Self-pay | Admitting: Sports Medicine

## 2020-03-15 ENCOUNTER — Other Ambulatory Visit: Payer: Self-pay

## 2020-03-15 ENCOUNTER — Ambulatory Visit (INDEPENDENT_AMBULATORY_CARE_PROVIDER_SITE_OTHER): Payer: BLUE CROSS/BLUE SHIELD

## 2020-03-15 ENCOUNTER — Other Ambulatory Visit: Payer: Self-pay | Admitting: Sports Medicine

## 2020-03-15 DIAGNOSIS — M79672 Pain in left foot: Secondary | ICD-10-CM

## 2020-03-15 DIAGNOSIS — M779 Enthesopathy, unspecified: Secondary | ICD-10-CM | POA: Diagnosis not present

## 2020-03-15 DIAGNOSIS — M79671 Pain in right foot: Secondary | ICD-10-CM

## 2020-03-15 DIAGNOSIS — M7752 Other enthesopathy of left foot: Secondary | ICD-10-CM

## 2020-03-15 DIAGNOSIS — M7750 Other enthesopathy of unspecified foot: Secondary | ICD-10-CM

## 2020-03-15 DIAGNOSIS — M7751 Other enthesopathy of right foot: Secondary | ICD-10-CM | POA: Diagnosis not present

## 2020-03-15 MED ORDER — METHYLPREDNISOLONE 4 MG PO TBPK: ORAL_TABLET | ORAL | 0 refills | Status: DC

## 2020-03-15 MED ORDER — MELOXICAM 15 MG PO TABS: 15.0000 mg | ORAL_TABLET | Freq: Every day | ORAL | 0 refills | Status: DC

## 2020-03-15 NOTE — Progress Notes (Signed)
Subjective: Terry York is a 49 y.o. male patient who presents to office for evaluation of right > left foot pain. Patient complains of progressive pain especially over the last  6 months on right foot and 5 months on the left across the ball and at the top and arch worse on the right. Admits an increased amount of exercise that started the pain and bowling. Patient denies any other pedal complaints. Denies injury/trip/fall/sprain/any other causative factors.   There are no problems to display for this patient.   Current Outpatient Medications on File Prior to Visit  Medication Sig Dispense Refill  . CONDYLOX 0.5 % gel Apply topically.    . imiquimod (ALDARA) 5 % cream Apply topically.    Marland Kitchen lisinopril (PRINIVIL,ZESTRIL) 20 MG tablet Take 20 mg by mouth daily.   1  . montelukast (SINGULAIR) 10 MG tablet Take 10 mg by mouth daily.    . Omega-3 Fatty Acids (FISH OIL) 1000 MG CAPS Take 1,000 mg by mouth 2 (two) times a day.    . tadalafil (CIALIS) 5 MG tablet Take 5 mg by mouth daily.   4   Current Facility-Administered Medications on File Prior to Visit  Medication Dose Route Frequency Provider Last Rate Last Admin  . triamcinolone acetonide (KENALOG) 10 MG/ML injection 10 mg  10 mg Other Once Landis Martins, DPM        No Known Allergies  Objective:  General: Alert and oriented x3 in no acute distress  Dermatology: No open lesions bilateral lower extremities, no webspace macerations, no ecchymosis bilateral, all nails x 10 are well manicured.  Vascular: Dorsalis Pedis and Posterior Tibial pedal pulses palpable, Capillary Fill Time 3 seconds,(+) pedal hair growth bilateral, no edema bilateral lower extremities, Temperature gradient within normal limits.  Neurology: [name] sensation intact via light touch bilateral.   Muscul largeroskeletal: Mild tenderness with palpation at right arch and diffusely across the midtarsal joint.  There is mild pain noted to the ball of the foot on the left  diffusely.  There is significant limited range of motion at the first MPJs bilateral and limited ankle joint range of motion bilateral.  Strength within normal limits in all groups bilateral.   Gait: Antalgic gait  Xrays  Right and left foot   Impression: Decreased first metatarsophalangeal joint space bilateral right greater than left, mild varus fourth and fifth hammertoe deformity noted.  Midtarsal breech supportive of pes planus deformity.  Assessment and Plan: Problem List Items Addressed This Visit   None   Visit Diagnoses    Pain in right foot    -  Primary   Relevant Orders   DG Foot Complete Right   Pain in left foot       Relevant Orders   DG Foot Complete Left   Arch pain of right foot       Tendonitis           -Complete examination performed -Xrays reviewed -Discussed treatment options for bilateral foot pain -Rx Medrol Dosepak and meloxicam to take as instructed -Dispensed plantar fascial brace for patient to use on right to provide additional arch support advised patient this works well may benefit from custom functional foot orthotics -Patient to return to office as scheduled or sooner if condition worsens.  Landis Martins, DPM

## 2020-03-26 ENCOUNTER — Telehealth: Payer: Self-pay | Admitting: Sports Medicine

## 2020-03-26 NOTE — Telephone Encounter (Signed)
We have one in the supply room. Remind me tomorrow to get you a brace.

## 2020-03-26 NOTE — Telephone Encounter (Signed)
Pt  Left message stating he has misplaced his plantar fascitis brace that you gave him and would like to pick up another one.  I returned call and pt wears a size 12 shoe. Upon looking we are out of the size lg/xl braces and told pt I they are ordered and when they come in someone would call him.Marland Kitchen  He said he was going to need one soon and may look on line and order one but to please call him when they come in.

## 2020-03-27 DIAGNOSIS — M79671 Pain in right foot: Secondary | ICD-10-CM | POA: Diagnosis not present

## 2020-03-27 DIAGNOSIS — M779 Enthesopathy, unspecified: Secondary | ICD-10-CM

## 2020-03-27 NOTE — Telephone Encounter (Signed)
Brace came in and I notified pt ok to pick up and we would bill insurance.

## 2020-04-08 ENCOUNTER — Other Ambulatory Visit: Payer: Self-pay | Admitting: Sports Medicine

## 2020-04-15 NOTE — Telephone Encounter (Signed)
Please advise 

## 2020-05-03 ENCOUNTER — Other Ambulatory Visit: Payer: Self-pay

## 2020-05-03 ENCOUNTER — Encounter: Payer: Self-pay | Admitting: Sports Medicine

## 2020-05-03 ENCOUNTER — Ambulatory Visit: Payer: BLUE CROSS/BLUE SHIELD | Admitting: Sports Medicine

## 2020-05-03 DIAGNOSIS — M722 Plantar fascial fibromatosis: Secondary | ICD-10-CM | POA: Diagnosis not present

## 2020-05-03 DIAGNOSIS — M779 Enthesopathy, unspecified: Secondary | ICD-10-CM

## 2020-05-03 DIAGNOSIS — M2141 Flat foot [pes planus] (acquired), right foot: Secondary | ICD-10-CM | POA: Diagnosis not present

## 2020-05-03 DIAGNOSIS — M2142 Flat foot [pes planus] (acquired), left foot: Secondary | ICD-10-CM

## 2020-05-03 DIAGNOSIS — E669 Obesity, unspecified: Secondary | ICD-10-CM | POA: Insufficient documentation

## 2020-05-03 DIAGNOSIS — M79671 Pain in right foot: Secondary | ICD-10-CM

## 2020-05-03 DIAGNOSIS — M205X1 Other deformities of toe(s) (acquired), right foot: Secondary | ICD-10-CM

## 2020-05-03 DIAGNOSIS — K602 Anal fissure, unspecified: Secondary | ICD-10-CM | POA: Insufficient documentation

## 2020-05-03 DIAGNOSIS — K629 Disease of anus and rectum, unspecified: Secondary | ICD-10-CM | POA: Insufficient documentation

## 2020-05-03 DIAGNOSIS — M79672 Pain in left foot: Secondary | ICD-10-CM

## 2020-05-03 DIAGNOSIS — K625 Hemorrhage of anus and rectum: Secondary | ICD-10-CM | POA: Insufficient documentation

## 2020-05-03 DIAGNOSIS — Z1211 Encounter for screening for malignant neoplasm of colon: Secondary | ICD-10-CM | POA: Insufficient documentation

## 2020-05-03 DIAGNOSIS — Z800 Family history of malignant neoplasm of digestive organs: Secondary | ICD-10-CM | POA: Insufficient documentation

## 2020-05-03 DIAGNOSIS — K648 Other hemorrhoids: Secondary | ICD-10-CM | POA: Insufficient documentation

## 2020-05-03 NOTE — Progress Notes (Signed)
Subjective: Terry York is a 50 y.o. male patient who returns to office for f/u R>L foot pain. Patient reports that the medication and brace has helped greatly. Only very minor pain with brace at top of foot after several hours but otherwise is doing good. No other issues noted.  Patient Active Problem List   Diagnosis Date Noted  . Anal fissure 05/03/2020  . Colon cancer screening 05/03/2020  . Family history of malignant neoplasm of gastrointestinal tract 05/03/2020  . Anorectal disorder 05/03/2020  . Hemorrhage of anus and rectum 05/03/2020  . Rectal bleeding 05/03/2020  . Internal hemorrhoids 05/03/2020  . Obesity 05/03/2020    Current Outpatient Medications on File Prior to Visit  Medication Sig Dispense Refill  . CONDYLOX 0.5 % gel Apply topically.    . imiquimod (ALDARA) 5 % cream Apply topically.    Marland Kitchen lisinopril (PRINIVIL,ZESTRIL) 20 MG tablet Take 20 mg by mouth daily.   1  . meloxicam (MOBIC) 15 MG tablet TAKE 1 TABLET BY MOUTH EVERY DAY 30 tablet 0  . methylPREDNISolone (MEDROL DOSEPAK) 4 MG TBPK tablet Take as directed 21 tablet 0  . montelukast (SINGULAIR) 10 MG tablet Take 10 mg by mouth daily.    . Omega-3 Fatty Acids (FISH OIL) 1000 MG CAPS Take 1,000 mg by mouth 2 (two) times a day.    . tadalafil (CIALIS) 5 MG tablet Take 5 mg by mouth daily.   4   Current Facility-Administered Medications on File Prior to Visit  Medication Dose Route Frequency Provider Last Rate Last Admin  . triamcinolone acetonide (KENALOG) 10 MG/ML injection 10 mg  10 mg Other Once Landis Martins, DPM        No Known Allergies  Objective:  General: Alert and oriented x3 in no acute distress  Dermatology: No open lesions bilateral lower extremities, no webspace macerations, no ecchymosis bilateral, all nails x 10 are well manicured.  Vascular: Dorsalis Pedis and Posterior Tibial pedal pulses palpable, Capillary Fill Time 3 seconds,(+) pedal hair growth bilateral, no edema bilateral lower  extremities, Temperature gradient within normal limits.  Neurology: [name] sensation intact via light touch bilateral.   Muscul largeroskeletal:No reproducible tenderness with palpation at right arch or at the midtarsal joint.  There is resolved  pain noted to the ball of the foot on the left diffusely. No pain to plantar fascia insertion bilateral.  There is significant limited range of motion at the first MPJs bilateral and limited ankle joint range of motion bilateral. + pes planus foot type. Strength within normal limits in all groups bilateral.   Assessment and Plan: Problem List Items Addressed This Visit   None   Visit Diagnoses    Arch pain of right foot    -  Primary   Tendonitis       Pes planus of both feet       Plantar fasciitis       Hallux limitus of right foot       Pain in right foot       Pain in left foot          -Complete examination performed -Continue with fascial brace -Continue with Mobic PRN -Recommend daily stretching to prevent recurrence in symptoms -Rx Custom orthotics with rick since PF brace has been helpful -Patient to return to office as scheduled or sooner if condition worsens.  Landis Martins, DPM

## 2020-05-17 ENCOUNTER — Other Ambulatory Visit: Payer: Self-pay

## 2020-05-17 ENCOUNTER — Other Ambulatory Visit: Payer: BLUE CROSS/BLUE SHIELD | Admitting: Orthotics

## 2020-06-14 ENCOUNTER — Other Ambulatory Visit: Payer: BLUE CROSS/BLUE SHIELD

## 2020-06-19 ENCOUNTER — Ambulatory Visit (INDEPENDENT_AMBULATORY_CARE_PROVIDER_SITE_OTHER): Payer: BLUE CROSS/BLUE SHIELD | Admitting: Sports Medicine

## 2020-06-19 ENCOUNTER — Other Ambulatory Visit: Payer: Self-pay

## 2020-06-19 DIAGNOSIS — M779 Enthesopathy, unspecified: Secondary | ICD-10-CM

## 2020-06-19 DIAGNOSIS — M722 Plantar fascial fibromatosis: Secondary | ICD-10-CM

## 2020-06-19 DIAGNOSIS — M205X1 Other deformities of toe(s) (acquired), right foot: Secondary | ICD-10-CM

## 2020-06-19 NOTE — Progress Notes (Signed)
Patient presents today for orthotic pick up. Patient voices no new complaints.  Orthotics were fitted to patient's feet. No discomfort and no rubbing. Patient satisfied with the orthotics.  Orthotics were dispensed to patient with instructions for break in wear and to call the office with any concerns or questions. Lattie Haw

## 2020-06-19 NOTE — Progress Notes (Signed)
Patient was seen and evaluated by medical assistant. Patient was dispensed 1 pair of custom functional foot orthotics with wear and break-in period explained. Patient to return for orthotic check as scheduled. -Dr. Cannon Kettle

## 2020-06-19 NOTE — Patient Instructions (Signed)

## 2020-11-16 ENCOUNTER — Other Ambulatory Visit (HOSPITAL_BASED_OUTPATIENT_CLINIC_OR_DEPARTMENT_OTHER): Payer: Self-pay

## 2020-11-16 DIAGNOSIS — G4710 Hypersomnia, unspecified: Secondary | ICD-10-CM

## 2020-11-16 DIAGNOSIS — R0683 Snoring: Secondary | ICD-10-CM

## 2020-11-16 DIAGNOSIS — R0681 Apnea, not elsewhere classified: Secondary | ICD-10-CM

## 2020-11-16 DIAGNOSIS — G4700 Insomnia, unspecified: Secondary | ICD-10-CM

## 2020-12-22 IMAGING — CT CT NECK W/ CM
3 of 4 series · 6 of 14 positions shown, 7 images · IV contrast (iopamidol)
Comparison: None.

CLINICAL DATA: 49-year-old male with right side neck fullness for 1
month.

EXAM:
CT NECK WITH CONTRAST
TECHNIQUE: Multidetector CT imaging of the neck was performed using the
standard protocol following the bolus administration of intravenous
contrast.
CONTRAST:  75mL [id number] IOPAMIDOL ([id number]) INJECTION 61%

[Series 3: neck · axial · 0.49mm/px · z∈[+870,+960]mm · 2 of 137 slices shown, 3 images]
[im 46/137  soft-tissue]
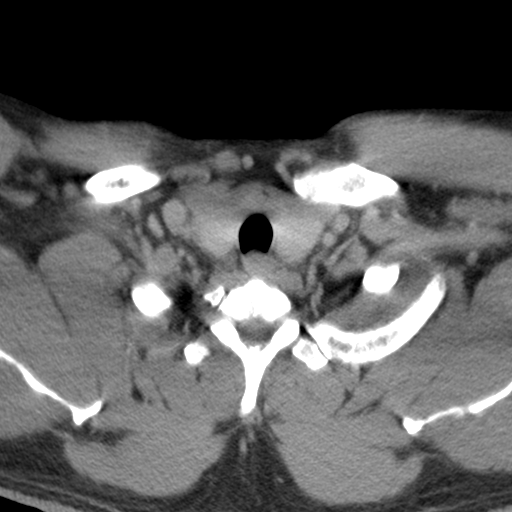
[im 46/137  bone]
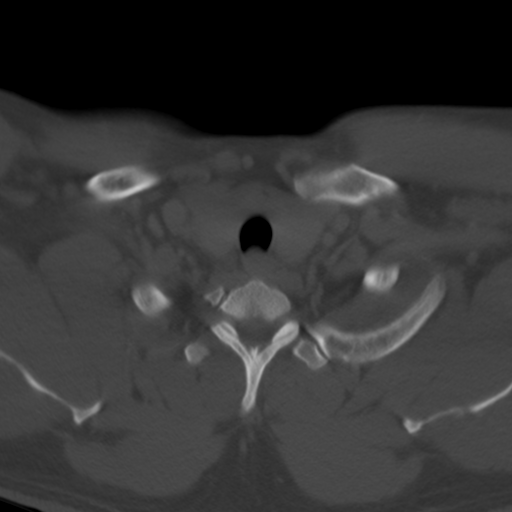
[im 91/137  bone]
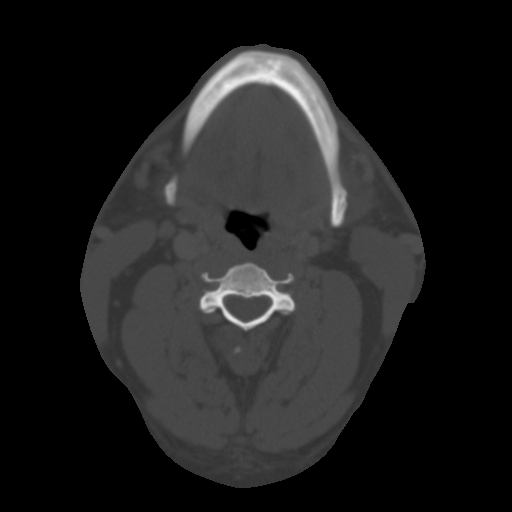

[Series 8: angled axial-oropharynx · axial · 0.52mm/px · z∈[+861,+951]mm · 2 of 136 slices shown]
[im 46/136  bone]
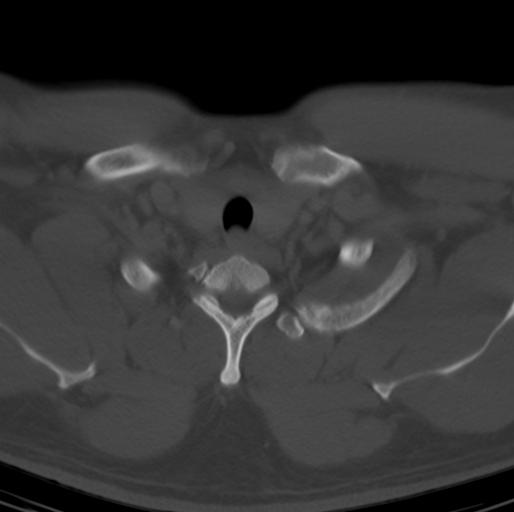
[im 91/136  bone]
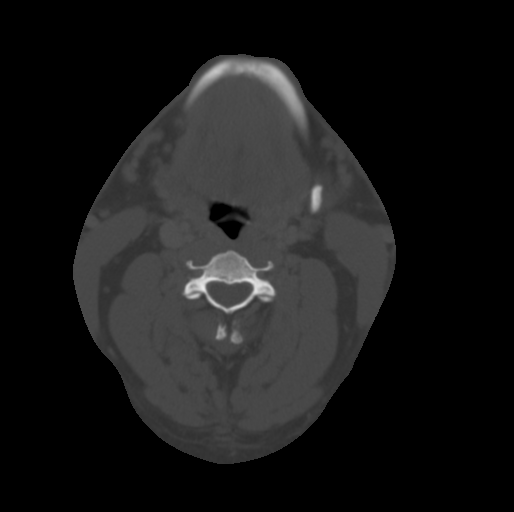

[Series 9: angled (person_name) · axial · 0.52mm/px · z∈[+879,+969]mm · 2 of 137 slices shown]
[im 46/137  bone]
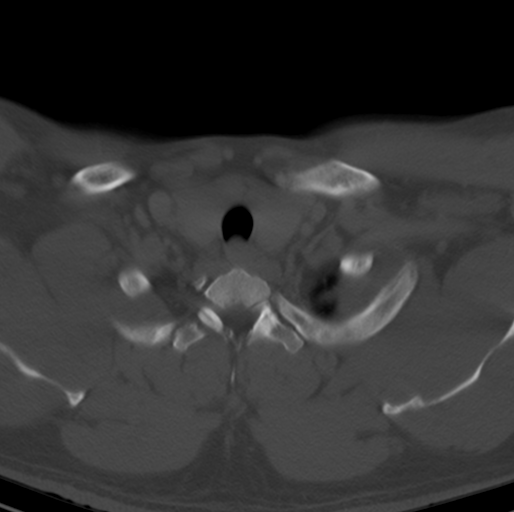
[im 91/137  bone]
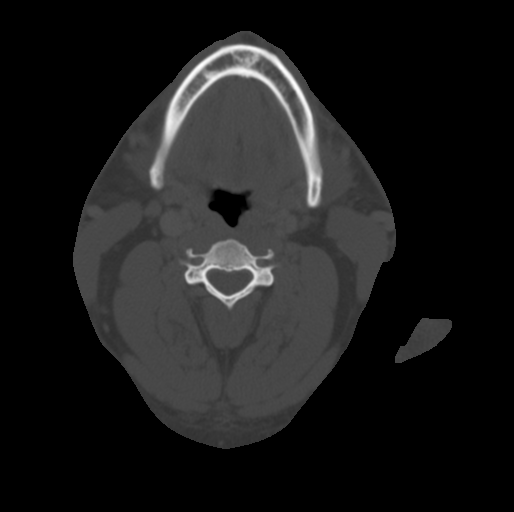

[6 of 14 positions shown; findings below may reference images not displayed]

FINDINGS: Pharynx and larynx: Mild motion artifact. Larynx soft tissue
contours including the epiglottis appear normal. Pharyngeal soft
tissues are symmetric and within normal limits. Small
postinflammatory calcifications of the right palatine tonsil (series
3, image 37). Negative parapharyngeal and retropharyngeal spaces.

Salivary glands: Negative sublingual space. Submandibular glands and
parotid glands are symmetric and within normal limits.

Thyroid: Negative.

Lymph nodes: Negative.  No cervical lymphadenopathy.

No specific areas of clinical concern were marked for this exam.

Vascular: Suboptimal intravascular contrast bolus but the major
vascular structures in the neck and at the skull base appear to be
patent. No atherosclerosis identified.

Limited intracranial: Negative.

Visualized orbits: Minimally included and negative.

Mastoids and visualized paranasal sinuses: Small right maxillary
sinus mucous retention cyst in the alveolar recess (series 5, image
18). Other visible paranasal sinuses are well pneumatized. Tympanic
cavities and mastoids are clear.

Skeleton: No acute osseous abnormality identified. Multilevel lower
cervical spine disc, endplate, and some left side facet
degeneration.

Upper chest: Negative.  Normal visible axillary lymph nodes.
IMPRESSION: 1. Normal CT appearance of the Neck. No mass, lymphadenopathy, or
inflammation.
2. Lower cervical spine degeneration.

## 2021-01-06 ENCOUNTER — Other Ambulatory Visit: Payer: Self-pay

## 2021-01-06 ENCOUNTER — Ambulatory Visit (HOSPITAL_BASED_OUTPATIENT_CLINIC_OR_DEPARTMENT_OTHER): Payer: BLUE CROSS/BLUE SHIELD | Attending: Family Medicine | Admitting: Internal Medicine

## 2021-01-06 VITALS — Ht 70.0 in | Wt 237.0 lb

## 2021-01-06 DIAGNOSIS — I493 Ventricular premature depolarization: Secondary | ICD-10-CM | POA: Insufficient documentation

## 2021-01-06 DIAGNOSIS — R0681 Apnea, not elsewhere classified: Secondary | ICD-10-CM

## 2021-01-06 DIAGNOSIS — G4710 Hypersomnia, unspecified: Secondary | ICD-10-CM

## 2021-01-06 DIAGNOSIS — R0683 Snoring: Secondary | ICD-10-CM | POA: Diagnosis not present

## 2021-01-06 DIAGNOSIS — Z79899 Other long term (current) drug therapy: Secondary | ICD-10-CM | POA: Diagnosis not present

## 2021-01-06 DIAGNOSIS — G4733 Obstructive sleep apnea (adult) (pediatric): Secondary | ICD-10-CM | POA: Insufficient documentation

## 2021-01-06 DIAGNOSIS — G4700 Insomnia, unspecified: Secondary | ICD-10-CM

## 2021-01-12 DIAGNOSIS — R0683 Snoring: Secondary | ICD-10-CM | POA: Diagnosis not present

## 2021-01-12 NOTE — Procedures (Signed)
   Patient Name: Terry York, Terry York Date: 01/06/2021 Gender: Male D.O.B: [birth year] Age (years): 50 Referring Provider: Lin Landsman Height (inches): 70 Interpreting Physician: Baird Lyons MD, ABSM Weight (lbs): 237 RPSGT: Baxter Flattery BMI: 34 MRN: [id number] Neck Size: 18.00  CLINICAL INFORMATION Sleep Study Type: NPSG Indication for sleep study: Obesity, Snoring, Witnesses Apnea / Gasping During Sleep Epworth Sleepiness Score: 5  SLEEP STUDY TECHNIQUE As per the AASM Manual for the Scoring of Sleep and Associated Events v2.3 (April 2016) with a hypopnea requiring 4% desaturations.  The channels recorded and monitored were frontal, central and occipital EEG, electrooculogram (EOG), submentalis EMG (chin), nasal and oral airflow, thoracic and abdominal wall motion, anterior tibialis EMG, snore microphone, electrocardiogram, and pulse oximetry.  MEDICATIONS Medications self-administered by patient taken the night of the study : amberen, ALLEGRA, AMITRIPTYLINE, VENLAFAXINE, [facility]  SLEEP ARCHITECTURE The study was initiated at 11:28:20 PM and ended at 5:34:41 AM.  Sleep onset time was 0.0 minutes and the sleep efficiency was 24.4%%. The total sleep time was 89.5 minutes.  Stage REM latency was N/A minutes.  The patient spent 14.5%% of the night in stage N1 sleep, 85.5%% in stage N2 sleep, 0.0%% in stage N3 and 0% in REM.  Alpha intrusion was absent.  Supine sleep was 0.00%.  RESPIRATORY PARAMETERS The overall apnea/hypopnea index (AHI) was 35.5 per hour. There were 53 total apneas, including 47 obstructive, 5 central and 1 mixed apneas. There were 0 hypopneas and 2 RERAs.  The AHI during Stage REM sleep was N/A per hour.  AHI while supine was N/A per hour.  The mean oxygen saturation was 96.3%. The minimum SpO2 during sleep was 91.0%.  loud snoring was noted during this study.  CARDIAC DATA The 2 lead EKG demonstrated sinus rhythm. The mean heart rate was 64.7  beats per minute. Other EKG findings include: PVCs.  LEG MOVEMENT DATA The total PLMS were 0 with a resulting PLMS index of 0.0. Associated arousal with leg movement index was 0.0 .  IMPRESSIONS - Severe obstructive sleep apnea occurred during this study (AHI = 35.5/h). - Insufficient early events and sleep to meet protocol requirement for split night CPAP titration. - Significant difficulty initiating and maintaining sleep despite listed medication, bnt OSA present while sleeping. - No significant central sleep apnea occurred during this study (CAI = 3.4/h). - The patient had minimal or no oxygen desaturation during the study (Min O2 = 91.0%) - The patient snored with loud snoring volume. - EKG findings include PVCs. - Clinically significant periodic limb movements did not occur during sleep. No significant associated arousals.  DIAGNOSIS - Obstructive Sleep Apnea (G47.33)  RECOMMENDATIONS - Suggest CPAP titration sleep study or autopap. Other options would be based on clinical judgment. - Insomnia may also need to be addressed, if important in the home environment.. - Sleep hygiene should be reviewed to assess factors that may improve sleep quality. - Weight management and regular exercise should be initiated or continued if appropriate.  [Electronically signed] 01/12/2021 11:53 AM  Baird Lyons MD, ABSM Diplomate, American Board of Sleep Medicine   NPI: [id number]                          [place], [place] [facility]  ELECTRONICALLY SIGNED ON:  01/12/2021, 11:47 AM [place] PH: [phone]   FX: [phone] [place]

## 2021-01-23 ENCOUNTER — Encounter (HOSPITAL_BASED_OUTPATIENT_CLINIC_OR_DEPARTMENT_OTHER): Payer: Self-pay

## 2021-01-23 ENCOUNTER — Ambulatory Visit (HOSPITAL_BASED_OUTPATIENT_CLINIC_OR_DEPARTMENT_OTHER): Payer: BLUE CROSS/BLUE SHIELD | Attending: Internal Medicine | Admitting: Internal Medicine

## 2021-01-23 ENCOUNTER — Other Ambulatory Visit: Payer: Self-pay

## 2021-01-23 DIAGNOSIS — G4733 Obstructive sleep apnea (adult) (pediatric): Secondary | ICD-10-CM | POA: Diagnosis present

## 2021-01-23 DIAGNOSIS — Z9989 Dependence on other enabling machines and devices: Secondary | ICD-10-CM

## 2021-01-27 DIAGNOSIS — G4733 Obstructive sleep apnea (adult) (pediatric): Secondary | ICD-10-CM | POA: Diagnosis not present

## 2021-01-27 NOTE — Procedures (Signed)
    Patient Name: Terry York, Terry York Date: 01/23/2021 Gender: Male D.O.B: Apr 13, 1970 Age (years): 50 Referring Provider: Lin Landsman Height (inches): 70 Interpreting Physician: Baird Lyons MD, ABSM Weight (lbs): 237 RPSGT: Laren Everts BMI: 34 MRN: [id number]  CLINICAL INFORMATION The patient is referred for a CPAP titration to treat sleep apnea.  Date of NPSG, Split Night or HST: NPSG 01/06/21  AHI 35.5/ hr, desaturation to 91%, body weight 237 lbs,   SLEEP STUDY TECHNIQUE As per the AASM Manual for the Scoring of Sleep and Associated Events v2.3 (April 2016) with a hypopnea requiring 4% desaturations.  The channels recorded and monitored were frontal, central and occipital EEG, electrooculogram (EOG), submentalis EMG (chin), nasal and oral airflow, thoracic and abdominal wall motion, anterior tibialis EMG, snore microphone, electrocardiogram, and pulse oximetry. Continuous positive airway pressure (CPAP) was initiated at the beginning of the study and titrated to treat sleep-disordered breathing.  MEDICATIONS Medications self-administered by patient taken the night of the study : Advil PM  TECHNICIAN COMMENTS Comments added by technician: None Comments added by scorer: N/A  RESPIRATORY PARAMETERS Optimal PAP Pressure (cm): 12 AHI at Optimal Pressure (/hr): 0 Overall Minimal O2 (%): 92.0 Supine % at Optimal Pressure (%): 45 Minimal O2 at Optimal Pressure (%): 95.0   SLEEP ARCHITECTURE The study was initiated at 11:02:59 PM and ended at 5:05:44 AM.  Sleep onset time was 13.8 minutes and the sleep efficiency was 80.4%%. The total sleep time was 291.5 minutes.  The patient spent 14.9%% of the night in stage N1 sleep, 75.0%% in stage N2 sleep, 0.0%% in stage N3 and 10.1% in REM.Stage REM latency was 150.0 minutes  Wake after sleep onset was 57.5. Alpha intrusion was absent. Supine sleep was 29.28%.  CARDIAC DATA The 2 lead EKG demonstrated sinus rhythm. The mean heart  rate was 64.5 beats per minute. Other EKG findings include: None.  LEG MOVEMENT DATA The total Periodic Limb Movements of Sleep (PLMS) were 0. The PLMS index was 0.0. A PLMS index of <15 is considered normal in adults.  IMPRESSIONS - The optimal PAP pressure was 12 cm of water. - Significant oxygen desaturations were not observed during this titration (min O2 = 92.0%). - The patient snored with soft snoring volume during this titration study. - No cardiac abnormalities were observed during this study. - Clinically significant periodic limb movements were not noted during this study. Arousals associated with PLMs were rare.  DIAGNOSIS - Obstructive Sleep Apnea (G47.33)  RECOMMENDATIONS - Trial of CPAP therapy on 12 cm H2O or autopap 5-15. - Patient used a Large size Resmed Nasal Pillow Mask AirFit P30 mask and heated humidification. - Be careful with alcohol, sedatives and other CNS depressants that may worsen sleep apnea and disrupt normal sleep architecture. - Sleep hygiene should be reviewed to assess factors that may improve sleep quality. - Weight management and regular exercise should be initiated or continued.  [Electronically signed] 01/27/2021 11:48 AM  Baird Lyons MD, ABSM Diplomate, American Board of Sleep Medicine   NPI: [id number]                         [place], [place] [facility]  ELECTRONICALLY SIGNED ON:  01/27/2021, 11:45 AM [place] PH: [phone]   FX: [phone] [place]

## 2021-02-11 ENCOUNTER — Other Ambulatory Visit: Payer: Self-pay

## 2021-02-11 ENCOUNTER — Ambulatory Visit (INDEPENDENT_AMBULATORY_CARE_PROVIDER_SITE_OTHER): Payer: BLUE CROSS/BLUE SHIELD | Admitting: Pulmonary Disease

## 2021-02-11 ENCOUNTER — Encounter: Payer: Self-pay | Admitting: Pulmonary Disease

## 2021-02-11 VITALS — BP 138/82 | HR 90 | Temp 98.2°F | Ht 69.5 in | Wt 238.0 lb

## 2021-02-11 DIAGNOSIS — G4733 Obstructive sleep apnea (adult) (pediatric): Secondary | ICD-10-CM

## 2021-02-11 NOTE — Patient Instructions (Signed)
Will arrange for new CPAP machine  Follow up in 4 to 5 months

## 2021-02-11 NOTE — Progress Notes (Signed)
[facility]  Chief Complaint  Patient presents with   Consult    DME: [facility], cpap machine over 70yrs old, wants a new dme company    Past Surgical History:  He  has a past surgical history that includes Colonoscopy with propofol (N/A, 01/05/2015); Colonoscopy with propofol (N/A, 11/04/2018); and Hemorrhoid surgery (N/A, 12/03/2018).  Past Medical History:  HTN, Seizures, Allergies  Constitutional:  BP 138/82 (BP Location: Left Arm, Cuff Size: Large)   Pulse 90   Temp 98.2 F (36.8 C) (Oral)   Ht 5' 9.5" (1.765 m)   Wt 238 lb (108 kg)   SpO2 98%   BMI 34.64 kg/m   Brief Summary:  Terry York is a 50 y.o. male with obstructive sleep apnea.      Subjective:   He was diagnosed with sleep apnea years ago while living in [place].  He had repeat sleep study in 2013 to get a new machine.  His current S9 gave error message that motor life had expired.  He went back to his old machine.  Was getting supplies from [facility], but didn't like their service.  He had repeat sleep study in October that showed severe sleep apnea and good control with CPAP 12 cm H2O.  He works 2nd shift.  Goes to bed at different times depending on his work scheduled.  He takes melatonin about an hour before bed.  He then uses tylenol or ibuprofen pm.  He has used these for a while.  He tried [place] before, but didn't like the side effects.  He sometimes wakes up in the middle of his sleep, and can't go back to sleep.  He occasional drinks coffee.  He usually gets a lull around 4 pm.  He denies sleep walking, sleep talking, bruxism, or nightmares.  There is no history of restless legs.  He denies sleep hallucinations, sleep paralysis, or cataplexy.  The Epworth score is 9 out of 24.   Physical Exam:   Appearance - well kempt   ENMT - no sinus tenderness, no oral exudate, no LAN, Mallampati 4 airway, no stridor, scalloped tongue  Respiratory - equal breath sounds  bilaterally, no wheezing or rales  CV - s1s2 regular rate and rhythm, no murmurs  Ext - no clubbing, no edema  Skin - no rashes  Psych - normal mood and affect   Sleep Tests:  PSG 01/06/21 >> AHI 35.5, SpO2 low 91% CPAP titration 01/23/21 >> CPAP 12 cm H2O  Social History:  He  reports that he has never smoked. He has never used smokeless tobacco. He reports current alcohol use. He reports that he does not use drugs.  Family History:  His family history is not on file.     Assessment/Plan:   Obstructive sleep apnea. - reviewed his sleep study results - discussed how untreated sleep apnea can impact his health - treatment options reviewed - will arrange for new auto CPAP 8 to 14 cm H2O - previously used [facility], but wasn't satisfied with their service; will set him up with a different DME  Insomnia with shift work. - continue melatonin - advise him to try just using diphenhydramine 25 to 50 mg instead of tylenol or ibuprofen pm - reassess after he gets new CPAP set up  Time Spent Involved in Patient Care on Day of Examination:  34 minutes  Follow up:   Patient Instructions  Will arrange for new CPAP machine  Follow up in  4 to 5 months  Medication List:   Allergies as of 02/11/2021   No Known Allergies      Medication List        Accurate as of February 11, 2021  3:18 PM. If you have any questions, ask your nurse or doctor.          STOP taking these medications    Condylox 0.5 % gel Generic drug: podofilox Stopped by: Chesley Mires, MD   cromolyn 4 % ophthalmic solution Commonly known as: OPTICROM Stopped by: Chesley Mires, MD   Fish Oil 1000 MG Caps Stopped by: Chesley Mires, MD   imiquimod 5 % cream Commonly known as: ALDARA Stopped by: Chesley Mires, MD   meloxicam 15 MG tablet Commonly known as: MOBIC Stopped by: Chesley Mires, MD   methylPREDNISolone 4 MG Tbpk tablet Commonly known as: MEDROL DOSEPAK Stopped by: Chesley Mires, MD   zolpidem  10 MG tablet Commonly known as: AMBIEN Stopped by: Chesley Mires, MD       TAKE these medications    fexofenadine 180 MG tablet Commonly known as: ALLEGRA Take 180 mg by mouth daily.   lisdexamfetamine 10 MG capsule Commonly known as: VYVANSE   lisinopril 20 MG tablet Commonly known as: ZESTRIL Take 20 mg by mouth daily.   montelukast 10 MG tablet Commonly known as: SINGULAIR Take 10 mg by mouth daily.   tadalafil 5 MG tablet Commonly known as: CIALIS Take 5 mg by mouth daily.        Signature:  Chesley Mires, MD [place] Pager - [phone] 02/11/2021, 3:18 PM

## 2021-05-13 ENCOUNTER — Ambulatory Visit: Payer: 59 | Admitting: Pulmonary Disease

## 2021-05-13 ENCOUNTER — Other Ambulatory Visit: Payer: Self-pay

## 2021-05-13 ENCOUNTER — Encounter: Payer: Self-pay | Admitting: Pulmonary Disease

## 2021-05-13 VITALS — BP 128/62 | HR 94 | Temp 98.0°F | Ht 70.0 in | Wt 235.6 lb

## 2021-05-13 DIAGNOSIS — G4700 Insomnia, unspecified: Secondary | ICD-10-CM

## 2021-05-13 DIAGNOSIS — G4730 Sleep apnea, unspecified: Secondary | ICD-10-CM

## 2021-05-13 DIAGNOSIS — G4733 Obstructive sleep apnea (adult) (pediatric): Secondary | ICD-10-CM | POA: Diagnosis not present

## 2021-05-13 DIAGNOSIS — G4726 Circadian rhythm sleep disorder, shift work type: Secondary | ICD-10-CM

## 2021-05-13 NOTE — Progress Notes (Signed)
[facility]  Chief Complaint  Patient presents with   Follow-up    Pt is here for a new cpap follow up. Pt states that he does wear his machine with no issues.     Past Surgical History:  He  has a past surgical history that includes Colonoscopy with propofol (N/A, 01/05/2015); Colonoscopy with propofol (N/A, 11/04/2018); and Hemorrhoid surgery (N/A, 12/03/2018).  Past Medical History:  HTN, Seizures, Allergies  Constitutional:  BP 128/62 (BP Location: Left Arm, Patient Position: Sitting, Cuff Size: Normal)    Pulse 94    Temp 98 F (36.7 C) (Oral)    Ht 5\' 10"  (1.778 m)    Wt 235 lb 9.6 oz (106.9 kg)    SpO2 97%    BMI 33.81 kg/m   Brief Summary:  Terry York is a 51 y.o. male with obstructive sleep apnea.      Subjective:   He received his new CPAP machine.  Works well.  Has nasal pillows mask.  Not having sinus congestion or dry mouth.  Still working 2nd shift.  Uses melatonin and tylenol pm to help sleep.  Gets a lull in the mid-afternoon, but gets his second wind later in the evening.  Physical Exam:   Appearance - well kempt   ENMT - no sinus tenderness, no oral exudate, no LAN, Mallampati 4 airway, no stridor  Respiratory - equal breath sounds bilaterally, no wheezing or rales  CV - s1s2 regular rate and rhythm, no murmurs  Ext - no clubbing, no edema  Skin - no rashes  Psych - normal mood and affect    Sleep Tests:  PSG 01/06/21 >> AHI 35.5, SpO2 low 91% CPAP titration 01/23/21 >> CPAP 12 cm H2O Auto CPAP 03/21/21 to 05/12/21 >> used on 51 of 53 nights with average 7 hrs 23 min.  Average AHI 1.9 with median CPAP 11 and 95 th percentile CPAP 13 cm H2O  Social History:  He  reports that he has never smoked. He has never used smokeless tobacco. He reports current alcohol use. He reports that he does not use drugs.  Family History:  His family history is not on file.     Assessment/Plan:   Obstructive sleep  apnea. - he is compliant with CPAP and reports benefit from therapy - he uses [facility] for his DME - continue auto CPAP 8 to 14 cm H2O  Insomnia with shift work. - uses melatonin at night - discussed the concern about long term use of acetaminophen or NSAIDs - advised him to try using 12.5 to 25 mg diphenhydramine at night instead   Time Spent Involved in Patient Care on Day of Examination:  26 minutes  Follow up:   Patient Instructions  Follow up in 1 year  Medication List:   Allergies as of 05/13/2021   No Known Allergies      Medication List        Accurate as of May 13, 2021  9:51 AM. If you have any questions, ask your nurse or doctor.          fexofenadine 180 MG tablet Commonly known as: ALLEGRA Take 180 mg by mouth daily.   lisdexamfetamine 10 MG capsule Commonly known as: VYVANSE   lisinopril 20 MG tablet Commonly known as: ZESTRIL Take 20 mg by mouth daily.   montelukast 10 MG tablet Commonly known as: SINGULAIR Take 10 mg by mouth daily.   tadalafil 5 MG tablet Commonly  known as: CIALIS Take 5 mg by mouth daily.        Signature:  Chesley Mires, MD [place] Pager - [phone] 05/13/2021, 9:51 AM

## 2021-05-13 NOTE — Patient Instructions (Signed)
Follow up in 1 year.

## 2021-05-30 ENCOUNTER — Ambulatory Visit: Payer: 59 | Admitting: Sports Medicine

## 2021-06-27 ENCOUNTER — Ambulatory Visit: Payer: 59

## 2021-06-27 ENCOUNTER — Ambulatory Visit: Payer: 59 | Admitting: Sports Medicine

## 2021-06-27 ENCOUNTER — Other Ambulatory Visit: Payer: Self-pay

## 2021-06-27 DIAGNOSIS — M10471 Other secondary gout, right ankle and foot: Secondary | ICD-10-CM | POA: Diagnosis not present

## 2021-06-27 DIAGNOSIS — M205X1 Other deformities of toe(s) (acquired), right foot: Secondary | ICD-10-CM | POA: Diagnosis not present

## 2021-06-27 DIAGNOSIS — M79671 Pain in right foot: Secondary | ICD-10-CM

## 2021-06-27 DIAGNOSIS — M79672 Pain in left foot: Secondary | ICD-10-CM

## 2021-06-27 MED ORDER — COLCHICINE 0.6 MG PO TABS: 0.6000 mg | ORAL_TABLET | Freq: Every day | ORAL | 0 refills | Status: DC

## 2021-06-27 NOTE — Progress Notes (Signed)
Subjective: ?Terry York is a 51 y.o. male patient who presents to office for evaluation of right greater than left foot pain states that he had a flareup of gout 2 weeks ago and was treated in [place] states that the gout has now resolved but wants to further discuss any other treatment or care for gout.  Patient denies any other current pedal complaints.  ? ?Patient Active Problem List  ? Diagnosis Date Noted  ? OSA (obstructive sleep apnea) 01/23/2021  ? Anal fissure 05/03/2020  ? Colon cancer screening 05/03/2020  ? Family history of malignant neoplasm of gastrointestinal tract 05/03/2020  ? Anorectal disorder 05/03/2020  ? Hemorrhage of anus and rectum 05/03/2020  ? Rectal bleeding 05/03/2020  ? Internal hemorrhoids 05/03/2020  ? Obesity 05/03/2020  ? ? ?Current Outpatient Medications on File Prior to Visit  ?Medication Sig Dispense Refill  ? fexofenadine (ALLEGRA) 180 MG tablet Take 180 mg by mouth daily.    ? lisdexamfetamine (VYVANSE) 10 MG capsule     ? lisinopril (PRINIVIL,ZESTRIL) 20 MG tablet Take 20 mg by mouth daily.   1  ? montelukast (SINGULAIR) 10 MG tablet Take 10 mg by mouth daily.    ? tadalafil (CIALIS) 5 MG tablet Take 5 mg by mouth daily.   4  ? ?Current Facility-Administered Medications on File Prior to Visit  ?Medication Dose Route Frequency Provider Last Rate Last Admin  ? triamcinolone acetonide (KENALOG) 10 MG/ML injection 10 mg  10 mg Other Once Landis Martins, DPM      ? ? ?No Known Allergies ? ?Objective:  ?General: Alert and oriented x3 in no acute distress ? ?Dermatology: No obvious swelling redness warmth at the right first MPJ, No open lesions bilateral lower extremities, no webspace macerations, no ecchymosis bilateral, all nails x 10 are well manicured. ? ?Vascular: Dorsalis Pedis and Posterior Tibial pedal pulses 2/4, Capillary Fill Time 3 seconds,(+) pedal hair growth bilateral, no increase in temperature gradient over the right first MPJ. ? ?Neurology: Gross sensation  intact via light touch bilateral. ? ?Musculoskeletal: No reproducible tenderness noted at the right first MPJ.  There is unchanged limited first MPJ range of motion noted right greater than left with superimposed bunion deformity.  Pes planus foot type. ? ? ?      ?Assessment and Plan: ?Problem List Items Addressed This Visit   ?None ?Visit Diagnoses   ? ? 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 Relevant Medications  ? colchicine 0.6 MG tablet  ? Hallux limitus of right foot      ? Pain in right foot      ? Pain in left foot      ? ?  ?  ? ?-Complete examination performed ?-Xrays not performed this visit since patient's symptoms have resolved from prior treatment ?-Discussed treatement options for gouty arthritis and gout education provided ?-Rx Colchicine 0.'6mg'$  for patient to have on standby if he needs it for a future flareup however I did advise patient to see his PCP for a further discussion on long-term management of gout and to see if he is a candidate to be put on allopurinol ?-Return to office as needed or sooner if problems or issues arise. ? ?Landis Martins, DPM ? ?

## 2021-12-18 ENCOUNTER — Ambulatory Visit: Payer: 59 | Admitting: Cardiology

## 2021-12-18 ENCOUNTER — Encounter: Payer: Self-pay | Admitting: Cardiology

## 2021-12-18 VITALS — BP 146/82 | HR 78 | Temp 98.0°F | Resp 16 | Ht 70.0 in | Wt 231.0 lb

## 2021-12-18 DIAGNOSIS — I459 Conduction disorder, unspecified: Secondary | ICD-10-CM | POA: Insufficient documentation

## 2021-12-18 DIAGNOSIS — E782 Mixed hyperlipidemia: Secondary | ICD-10-CM | POA: Insufficient documentation

## 2021-12-18 DIAGNOSIS — R002 Palpitations: Secondary | ICD-10-CM | POA: Insufficient documentation

## 2021-12-18 DIAGNOSIS — I10 Essential (primary) hypertension: Secondary | ICD-10-CM | POA: Insufficient documentation

## 2021-12-18 MED ORDER — ATORVASTATIN CALCIUM 10 MG PO TABS: 10.0000 mg | ORAL_TABLET | Freq: Every day | ORAL | 1 refills | Status: DC

## 2021-12-18 NOTE — Progress Notes (Signed)
Patient referred by Lin Landsman, MD for palpitations  Subjective:   Terry York, male    DOB: [birth year], 51 y.o.   MRN: [id number]   Chief Complaint  Patient presents with   frequent heart beat skipping   New Patient (Initial Visit)    Referred by Dr. Lin Landsman     HPI  51 y.o. African-American male with hypertension, h/o seizures, palpitations  Patient works in [employer], technical sales support. He is minimally active. He reports palpitations on exertion, associated with cough.  He denies any chest pain or chest tightness symptoms.  Blood pressure elevated today, but generally normal at home.  Lipids are uncontrolled, but he has not tolerated Crestor in the past due to side effects of myalgia.   Past Medical History:  Diagnosis Date   Hypertension    Seizure ([place]) AGE 67   X 1 OR 2 NONE SINCE   Sleep apnea      Past Surgical History:  Procedure Laterality Date   COLONOSCOPY WITH PROPOFOL N/A 01/05/2015   Procedure: COLONOSCOPY WITH PROPOFOL;  Surgeon: Carol Ada, MD;  Location: [facility];  Service: Endoscopy;  Laterality: N/A;   COLONOSCOPY WITH PROPOFOL N/A 11/04/2018   Procedure: COLONOSCOPY WITH PROPOFOL;  Surgeon: Juanita Craver, MD;  Location: [facility];  Service: Endoscopy;  Laterality: N/A;   HEMORRHOID SURGERY N/A 12/03/2018   Procedure: HEMORRHOIDECTOMY (2ND COLUMN);  Surgeon: Leighton Ruff, MD;  Location: [facility];  Service: General;  Laterality: N/A;     Social History   Tobacco Use  Smoking Status Never  Smokeless Tobacco Never    Social History   Substance and Sexual Activity  Alcohol Use Yes   Comment: occasional     Family History  Problem Relation Age of Onset   Hypertension Mother       Current Outpatient Medications:    colchicine 0.6 MG tablet, Take 1 tablet (0.6 mg total) by mouth daily., Disp: 10 tablet, Rfl: 0   fexofenadine (ALLEGRA) 180 MG tablet, Take 180 mg by mouth daily., Disp: , Rfl:    lisdexamfetamine (VYVANSE)  10 MG capsule, , Disp: , Rfl:    lisinopril (PRINIVIL,ZESTRIL) 20 MG tablet, Take 20 mg by mouth daily. , Disp: , Rfl: 1   montelukast (SINGULAIR) 10 MG tablet, Take 10 mg by mouth daily., Disp: , Rfl:    tadalafil (CIALIS) 5 MG tablet, Take 5 mg by mouth daily. , Disp: , Rfl: 4  Current Facility-Administered Medications:    triamcinolone acetonide (KENALOG) 10 MG/ML injection 10 mg, 10 mg, Other, Once, Landis Martins, DPM   Cardiovascular and other pertinent studies:  Reviewed external labs and tests, independently interpreted  EKG 12/18/2021: Sinus rhythm 83 bpm  Possible old anteroseptal infarct Poor R-wave progression    Recent labs: 12/05/2021: Glucose 118, BUN/Cr 18/1.43. EGFR 60. Na/K 137/4.5. Rest of the CMP normal H/H 15/46. MCV 91. Platelets 288 HbA1C NA Chol 234, TG 206, HDL 42, LDL 155 TSH 0.9 normal    Review of Systems  Cardiovascular:  Positive for palpitations. Negative for chest pain, dyspnea on exertion, leg swelling and syncope.         Vitals:   12/18/21 0941  BP: (!) 146/82  Pulse: 78  Resp: 16  Temp: 98 F (36.7 C)  SpO2: 98%     Body mass index is 33.15 kg/m. Filed Weights   12/18/21 0941  Weight: 231 lb (104.8 kg)     Objective:   Physical Exam Vitals and nursing  note reviewed.  Constitutional:      General: He is not in acute distress. Neck:     Vascular: No JVD.  Cardiovascular:     Rate and Rhythm: Normal rate and regular rhythm.     Heart sounds: Normal heart sounds. No murmur heard. Pulmonary:     Effort: Pulmonary effort is normal.     Breath sounds: Normal breath sounds. No wheezing or rales.  Musculoskeletal:     Right lower leg: No edema.     Left lower leg: No edema.         Visit diagnoses:   ICD-10-CM   1. Skipped heart beats  I45.9 EKG 12-Lead       Orders Placed This Encounter  Procedures   EKG 12-Lead        Assessment & Recommendations:   51 y.o. African-American male with hypertension,  h/o seizures, palpitations  Palpitations: Typically not exertional.  Recommend exercise treadmill stress test, echocardiogram, telemetry.  Hypertension: Currently on lisinopril 20 mg daily.  Blood pressure generally lower at home.  If it remains elevated, recommend adding amlodipine 5 mg daily.  Mixed hyperlipidemia: We will try low-dose Lipitor 10 mg daily.  Also check CT   Thank you for referring the patient to [place]. Please feel free to contact with any questions.   Nigel Mormon, MD Pager: [phone] Office: [phone]

## 2021-12-19 ENCOUNTER — Ambulatory Visit: Payer: 59 | Admitting: Podiatry

## 2021-12-19 DIAGNOSIS — M205X2 Other deformities of toe(s) (acquired), left foot: Secondary | ICD-10-CM | POA: Diagnosis not present

## 2021-12-19 DIAGNOSIS — M205X1 Other deformities of toe(s) (acquired), right foot: Secondary | ICD-10-CM

## 2021-12-19 NOTE — Progress Notes (Signed)
Subjective:  Patient ID: Terry York, male    DOB: Nov 12, 1970,  MRN: [id number]  Chief Complaint  Patient presents with   sore toe jonts    Patient is here to check to make sure he does not have anything else going on, he has orthotics that he got from our office.patient sates that the joint pain is off and on, patient has gout and has had injections before.    51 y.o. male presents with concern for pain in the big toe joint of both feet.  He states has been going on for months to years.  He has previously had custom orthotics made in the past in our office approximately 2 years ago he thinks.  He is concerned that these orthotics are now wearing down and may be time for new pair as he is beginning to have more pain in the big toe joints.  He has been told he has arthritis in these joints in the past he also has a history of gout in both big toe joints as well.  Currently not having any concern for gout flare.  He states the pain in the big toe joints is somewhat on and off and moves around slightly depending on how much walking he is doing in the shoes he is wearing.  He does have a child with special needs and therefore states he has to be on his feet a lot to help with him which would limit his ability to be off his feet following any type of surgery that were to be done.   Past Medical History:  Diagnosis Date   Hypertension    Seizure ([place]) AGE 13   X 1 OR 2 NONE SINCE   Sleep apnea     No Known Allergies  ROS: Negative except as per HPI above  Objective:  General: AAO x3, NAD  Dermatological: With inspection and palpation of the right and left lower extremities there are no open sores, no preulcerative lesions, no rash or signs of infection present. Nails are of normal length thickness and coloration.   Vascular:  Dorsalis Pedis artery and Posterior Tibial artery pedal pulses are 2/4 bilateral.  Capillary fill time brisk < 3 sec. Pedal hair growth present. No varicosities and no  lower extremity edema present bilateral. There is no pain with calf compression, swelling, warmth, erythema.   Neruologic: Grossly intact via light touch bilateral. Protective threshold intact to all sites bilateral. Patellar and Achilles deep tendon reflexes 2+ bilateral. Negative Babinski reflex.   Musculoskeletal: Currently diminished range of motion of the first metatarsophalangeal joint bilateral.  There is pain with end dorsiflexion range of motion in both great toe joints.  First metatarsal appears slightly elevated bilaterally.  Significant osteophyte formation is palpable at the dorsal aspect of the first metatarsal head and medial aspect of the first metatarsal head bilaterally.  Gait: Unassisted, Nonantalgic.   No images are attached to the encounter.  Radiographs:  Deferred at this visit however I reviewed the radiographs from 2021 which demonstrate significant degenerative osseous changes at the first metatarsophalangeal joint bilaterally.  Particularly on the right foot there is abnormality of the joint space and narrowing of the joint as well as significant osteophyte formation on the dorsal aspect of the first metatarsal head. Assessment:   1. Hallux limitus of right foot   2. Hallux limitus of left foot      Plan:  Patient was evaluated and treated and all questions answered.  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  I explained that while we can proceed with conservative management including anti-inflammatory medications both topical and oral, offloading with custom orthotics, shoe gear and activity modification he may not have complete reduction in his pain.  I did explain that this surgical consideration will be for arthrodesis or fusion of the first metatarsal phalangeal joint bilaterally.  His right foot is hurting more than the left so I would recommend  proceeding with a more painful side at this time.  He states that he is unable to proceed with this option at this time as he cannot be nonweightbearing for the required amount of time postoperatively which would be approximately 4 weeks.  Therefore I recommend we proceed with custom orthotics with a Morton's extension to reduce motion at the first metatarsal phalangeal joint and hopefully prevent some of his pain.  Additionally as he has a mild to moderate pes planus foot type I believe the extra support for his arches will also provide benefit.  We will cast the patient for orthotics at this time and he will follow-up in approximately 3 months after wearing them to see if any modifications need to be made.  Return in about 3 months (around 03/20/2022) for follow up orthotics.          Everitt Amber, DPM [facility] / [facility]

## 2021-12-20 ENCOUNTER — Ambulatory Visit: Payer: 59

## 2021-12-20 DIAGNOSIS — R002 Palpitations: Secondary | ICD-10-CM

## 2021-12-30 ENCOUNTER — Ambulatory Visit
Admission: RE | Admit: 2021-12-30 | Discharge: 2021-12-30 | Disposition: A | Discharge status: Home / Self Care | Payer: 59 | Source: Ambulatory Visit | Attending: Cardiology | Admitting: Cardiology

## 2021-12-30 DIAGNOSIS — I10 Essential (primary) hypertension: Secondary | ICD-10-CM

## 2022-01-01 ENCOUNTER — Ambulatory Visit: Payer: 59

## 2022-01-01 DIAGNOSIS — I10 Essential (primary) hypertension: Secondary | ICD-10-CM

## 2022-01-01 DIAGNOSIS — R002 Palpitations: Secondary | ICD-10-CM

## 2022-01-06 ENCOUNTER — Telehealth: Payer: Self-pay | Admitting: Podiatry

## 2022-01-07 ENCOUNTER — Ambulatory Visit (INDEPENDENT_AMBULATORY_CARE_PROVIDER_SITE_OTHER): Payer: 59 | Admitting: *Deleted

## 2022-01-07 DIAGNOSIS — M205X2 Other deformities of toe(s) (acquired), left foot: Secondary | ICD-10-CM

## 2022-01-07 DIAGNOSIS — M205X1 Other deformities of toe(s) (acquired), right foot: Secondary | ICD-10-CM

## 2022-01-07 NOTE — Progress Notes (Signed)
Patient presents today to pick up custom molded foot orthotics, diagnosed with hallux limitus by Dr. Amalia Hailey.   Orthotics were dispensed and fit was satisfactory. Reviewed instructions for break-in and wear. Written instructions given to patient.  Patient will follow up as needed.   [facility] - order # [id number]

## 2022-01-10 ENCOUNTER — Other Ambulatory Visit: Payer: Self-pay | Admitting: Cardiology

## 2022-01-10 DIAGNOSIS — E782 Mixed hyperlipidemia: Secondary | ICD-10-CM

## 2022-02-01 ENCOUNTER — Encounter: Payer: Self-pay | Admitting: Podiatry

## 2022-02-03 ENCOUNTER — Other Ambulatory Visit (INDEPENDENT_AMBULATORY_CARE_PROVIDER_SITE_OTHER): Payer: 59 | Admitting: Podiatry

## 2022-02-03 DIAGNOSIS — M10471 Other secondary gout, right ankle and foot: Secondary | ICD-10-CM

## 2022-02-03 MED ORDER — COLCHICINE 0.6 MG PO TABS: 0.6000 mg | ORAL_TABLET | Freq: Every day | ORAL | 0 refills | Status: DC

## 2022-02-03 NOTE — Progress Notes (Signed)
Refill of colchicine sent per patient request.

## 2022-02-06 ENCOUNTER — Encounter: Payer: Self-pay | Admitting: Cardiology

## 2022-02-06 NOTE — Telephone Encounter (Signed)
From patient.

## 2022-03-20 ENCOUNTER — Ambulatory Visit: Payer: 59 | Admitting: Podiatry

## 2022-04-10 ENCOUNTER — Ambulatory Visit: Payer: 59 | Admitting: Podiatry

## 2022-04-24 ENCOUNTER — Ambulatory Visit: Payer: 59 | Admitting: Podiatry

## 2022-04-24 DIAGNOSIS — M205X1 Other deformities of toe(s) (acquired), right foot: Secondary | ICD-10-CM

## 2022-04-24 DIAGNOSIS — M79671 Pain in right foot: Secondary | ICD-10-CM | POA: Diagnosis not present

## 2022-04-24 DIAGNOSIS — M2142 Flat foot [pes planus] (acquired), left foot: Secondary | ICD-10-CM

## 2022-04-24 DIAGNOSIS — M205X2 Other deformities of toe(s) (acquired), left foot: Secondary | ICD-10-CM

## 2022-04-24 DIAGNOSIS — M722 Plantar fascial fibromatosis: Secondary | ICD-10-CM

## 2022-04-24 DIAGNOSIS — M2141 Flat foot [pes planus] (acquired), right foot: Secondary | ICD-10-CM

## 2022-04-24 NOTE — Progress Notes (Signed)
Subjective:  Patient ID: Terry York, male    DOB: 1970-04-20,  MRN: [id number]  Chief Complaint  Patient presents with   Follow-up    Patient is here for follow-up bilateral, he states that his feet feel better.    52 y.o. male presents for follow up bilateral foot pain.  Patient has previously been diagnosed with bilateral hallux limitus grade 3.  He has Morton's extension built into the custom made orthotics that were previously made for him after the last visit as an alternative to surgical intervention.  He says that he has had much less pain in both his arches as well as his first MPJ on the both feet.  He is very happy with the inserts and says that he is adjusting to them very well at this time.  No complaints or concerns.  Past Medical History:  Diagnosis Date   Hypertension    Seizure ([place]) AGE 26   X 1 OR 2 NONE SINCE   Sleep apnea     No Known Allergies  ROS: Negative except as per HPI above  Objective:  General: AAO x3, NAD  Dermatological: With inspection and palpation of the right and left lower extremities there are no open sores, no preulcerative lesions, no rash or signs of infection present. Nails are of normal length thickness and coloration.   Vascular:  Dorsalis Pedis artery and Posterior Tibial artery pedal pulses are 2/4 bilateral.  Capillary fill time brisk < 3 sec. Pedal hair growth present. No varicosities and no lower extremity edema present bilateral. There is no pain with calf compression, swelling, warmth, erythema.   Neruologic: Grossly intact via light touch bilateral. Protective threshold intact to all sites bilateral. Patellar and Achilles deep tendon reflexes 2+ bilateral. Negative Babinski reflex.   Musculoskeletal: Currently diminished range of motion of the first metatarsophalangeal joint bilateral.  There is pain with end dorsiflexion range of motion in both great toe joints.  First metatarsal appears slightly elevated bilaterally.  Significant  osteophyte formation is palpable at the dorsal aspect of the first metatarsal head and medial aspect of the first metatarsal head bilaterally.  Gait: Unassisted, Nonantalgic.   No images are attached to the encounter.  Radiographs:  Deferred   Assessment:   1. Hallux limitus of right foot   2. Hallux limitus of left foot   3. Plantar fasciitis   4. Arch pain of right foot   5. Pes planus of both feet       Plan:  Patient was evaluated and treated and all questions answered.  #Bilateral hallux limitus, grade 3 # Bilateral pes planus -Patient is much improved from prior visit in terms of his pain.  He is no longer having any pain in the arches or in the first MPJ much improved after use of the custom made orthotics that were produced.  He has no concerns with them. -Recommend continued use of the custom made orthotics stated they should last 1 to 2 years depending on wear pattern -Recommend continued anti-inflammatory medications as needed for pain flareups  -Also discussed surgical intervention being fusion of the first MPJ if he gets to a point where the inserts are no longer helping and has daily pain wants to get the issue permanently fixed.  Return if symptoms worsen or fail to improve.          Everitt Amber, DPM [facility] / [facility]

## 2022-04-24 NOTE — Patient Instructions (Signed)

## 2022-07-11 ENCOUNTER — Other Ambulatory Visit: Payer: Self-pay | Admitting: Cardiology

## 2022-07-11 DIAGNOSIS — E782 Mixed hyperlipidemia: Secondary | ICD-10-CM

## 2022-07-21 ENCOUNTER — Ambulatory Visit (HOSPITAL_BASED_OUTPATIENT_CLINIC_OR_DEPARTMENT_OTHER): Payer: 59 | Admitting: Pulmonary Disease

## 2022-07-21 ENCOUNTER — Encounter (HOSPITAL_BASED_OUTPATIENT_CLINIC_OR_DEPARTMENT_OTHER): Payer: Self-pay | Admitting: Pulmonary Disease

## 2022-07-21 VITALS — BP 136/92 | HR 75 | Temp 99.3°F | Ht 70.0 in | Wt 233.2 lb

## 2022-07-21 DIAGNOSIS — G4726 Circadian rhythm sleep disorder, shift work type: Secondary | ICD-10-CM | POA: Diagnosis not present

## 2022-07-21 DIAGNOSIS — G4700 Insomnia, unspecified: Secondary | ICD-10-CM

## 2022-07-21 DIAGNOSIS — G4730 Sleep apnea, unspecified: Secondary | ICD-10-CM | POA: Diagnosis not present

## 2022-07-21 DIAGNOSIS — G4733 Obstructive sleep apnea (adult) (pediatric): Secondary | ICD-10-CM | POA: Diagnosis not present

## 2022-07-21 NOTE — Patient Instructions (Signed)
Follow up in 1 year.

## 2022-07-21 NOTE — Progress Notes (Signed)
[name]  Chief Complaint  Patient presents with   Follow-up    Follow up. No complaints.     Past Surgical History:  He  has a past surgical history that includes Colonoscopy with propofol (N/A, 01/05/2015); Colonoscopy with propofol (N/A, 11/04/2018); and Hemorrhoid surgery (N/A, 12/03/2018).  Past Medical History:  HTN, Seizures, Allergies, Fatty liver  Constitutional:  BP (!) 136/92 (BP Location: Left Arm, Patient Position: Sitting, Cuff Size: Normal)   Pulse 75   Temp 99.3 F (37.4 C) (Oral)   Ht 5\' 10"  (1.778 m)   Wt 233 lb 3.2 oz (105.8 kg)   SpO2 99%   BMI 33.46 kg/m   Brief Summary:  Terry York is a 52 y.o. male with obstructive sleep apnea.      Subjective:   He has two jobs.  His regular job schedule is 5 days per week from 2 pm to 9 pm.  He goes to bed at 11 pm and gets out of bed at 7 am.  He sleeps through the night.  He takes unisom and melatonin around 1030 pm.  Using CPAP nightly.  Has nasal pillow mask.  No issue with pressure setting or mask fit.    Physical Exam:   Appearance - well kempt   ENMT - no sinus tenderness, no oral exudate, no LAN, Mallampati 3 airway, no stridor, scalloped tongue  Respiratory - equal breath sounds bilaterally, no wheezing or rales  CV - s1s2 regular rate and rhythm, no murmurs  Ext - no clubbing, no edema  Skin - no rashes  Psych - normal mood and affect     Sleep Tests:  PSG 01/06/21 >> AHI 35.5, SpO2 low 91% CPAP titration 01/23/21 >> CPAP 12 cm H2O Auto CPAP 08/26/21 to 09/24/21 >> used on 28 of 30 nights with average 7 hrs 7 min.  Average AHI 1.7 with median CPAP 11 and 95 th percentile CPAP 13 cm H2O  Cardiac Tests:  Cardiac CT 12/30/21 >> no coronary calcium identified Echo 01/01/22 >> EF 60 to 65%  Social History:  He  reports that he has never smoked. He has never used smokeless tobacco. He reports current alcohol use. He reports that he does not use  drugs.  Family History:  His family history includes Hypertension in his mother.     Assessment/Plan:   Obstructive sleep apnea. - he is compliant with CPAP and reports benefit from therapy - he uses [facility] for his DME - continue auto CPAP 8 to 14 cm H2O  Insomnia with shift work. - discussed sleep hygiene, and maintaining a regular sleep-wake schedule - he uses OTC melatonin and doxylamine (unisom)  Time Spent Involved in Patient Care on Day of Examination:  26 minutes  Follow up:   Patient Instructions  Follow up in 1 year  Medication List:   Allergies as of 07/21/2022   No Known Allergies      Medication List        Accurate as of July 21, 2022 11:06 AM. If you have any questions, ask your nurse or doctor.          STOP taking these medications    colchicine 0.6 MG tablet Stopped by: [name], MD   lisdexamfetamine 10 MG capsule Commonly known as: VYVANSE Stopped by: [name], MD       TAKE these medications    atorvastatin 10 MG tablet Commonly known as: LIPITOR TAKE 1 TABLET BY  MOUTH EVERY DAY   fexofenadine 180 MG tablet Commonly known as: ALLEGRA Take 180 mg by mouth daily.   lisinopril 20 MG tablet Commonly known as: ZESTRIL Take 20 mg by mouth daily.   montelukast 10 MG tablet Commonly known as: SINGULAIR Take 10 mg by mouth daily.   tadalafil 5 MG tablet Commonly known as: CIALIS Take 5 mg by mouth daily.        Signature:  [name], MD [facility]/Critical Care Pager - [phone] 07/21/2022, 11:06 AM

## 2022-09-26 ENCOUNTER — Other Ambulatory Visit: Payer: Self-pay | Admitting: Podiatry

## 2022-09-26 ENCOUNTER — Telehealth: Payer: Self-pay | Admitting: Podiatry

## 2022-09-26 DIAGNOSIS — M10471 Other secondary gout, right ankle and foot: Secondary | ICD-10-CM

## 2022-09-26 MED ORDER — COLCHICINE 0.6 MG PO TABS: 0.6000 mg | ORAL_TABLET | Freq: Every day | ORAL | 0 refills | Status: DC

## 2022-09-26 NOTE — Progress Notes (Signed)
Rx for colchicine sent.

## 2022-09-26 NOTE — Telephone Encounter (Signed)
Pt wanting a refill  Rx colchicine for gout.  Please advise

## 2022-09-29 NOTE — Telephone Encounter (Signed)
Called patient, could not leave a  voice message, mail box still full.

## 2022-12-17 ENCOUNTER — Ambulatory Visit (HOSPITAL_BASED_OUTPATIENT_CLINIC_OR_DEPARTMENT_OTHER): Payer: 59 | Admitting: Family Medicine

## 2022-12-17 ENCOUNTER — Encounter (HOSPITAL_BASED_OUTPATIENT_CLINIC_OR_DEPARTMENT_OTHER): Payer: Self-pay | Admitting: Family Medicine

## 2022-12-17 VITALS — BP 123/80 | HR 85 | Temp 98.4°F | Ht 70.0 in | Wt 223.7 lb

## 2022-12-17 DIAGNOSIS — I10 Essential (primary) hypertension: Secondary | ICD-10-CM | POA: Diagnosis not present

## 2022-12-17 DIAGNOSIS — M25512 Pain in left shoulder: Secondary | ICD-10-CM | POA: Diagnosis not present

## 2022-12-17 DIAGNOSIS — Z23 Encounter for immunization: Secondary | ICD-10-CM | POA: Diagnosis not present

## 2022-12-17 DIAGNOSIS — E782 Mixed hyperlipidemia: Secondary | ICD-10-CM | POA: Diagnosis not present

## 2022-12-17 DIAGNOSIS — R7303 Prediabetes: Secondary | ICD-10-CM | POA: Insufficient documentation

## 2022-12-17 NOTE — Assessment & Plan Note (Signed)
Currently tolerating statin therapy, can continue with medication at this time Results of recent lipid panel not available in our system, would consider recheck in the future

## 2022-12-17 NOTE — Assessment & Plan Note (Signed)
Feel that symptoms are more related to trapezius, normal shoulder exam in office today.  I do feel the patient would do well with conservative management.  He has been working with chiropractor which he can continue to do so if interested.  Also discussed recommendation for formal physical therapy and benefits which can be seen with this.  Patient is amenable to referral to PT, referral placed today

## 2022-12-17 NOTE — Assessment & Plan Note (Signed)
Patient thinks most recent hemoglobin A1c was 6.1%.  Currently managing with metformin and Trulicity.  Can continue with current medication regimen.  Would need to request records to review prior A1c and determine when to recheck hemoglobin A1c for monitoring

## 2022-12-17 NOTE — Assessment & Plan Note (Signed)
Blood pressure appropriate in office today, can continue with current medication regimen, no changes made today Recommend intermittent monitoring of blood pressure at home, DASH diet

## 2022-12-17 NOTE — Progress Notes (Signed)
New Patient Office Visit  Subjective    Patient ID: Terry York, male    DOB: [birth year]  Age: 52 y.o. 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 Worse with long-distance driving. 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 Mild numbness, no tingling. Extends from neck to shoulder, no radiation into upper extremity. Has seen chiropractor, has tried TENS, occasionally using NSAID. Wenda [place] pas has helped some. Denies prior issues with the shoulder.  Gout: takes colchicine as needed. Has decreased beer intake which has been helpful. Follows with podiatry. No daily medication used, no allopurinol  Prediabetes: reports being started on metformin and Trulicity related to this. Thinks last A1c was 6.1%.  HTN: takes lisinopril and amlodipine for blood pressure. Denies any side effects from medications.  HLD: takes atorvastatin for cholesterol, tolerating well.  Patient is originally from [place]. Has lived here for about 12 years. Works in Consulting civil engineer, also as Naval architect. Outside of work, he enjoys reading, playing video games.   Outpatient Encounter Medications as of 12/17/2022  Medication Sig   amLODipine (NORVASC) 5 MG tablet Take 5 mg by mouth daily.   atorvastatin (LIPITOR) 10 MG tablet TAKE 1 TABLET BY MOUTH EVERY DAY   lisinopril (PRINIVIL,ZESTRIL) 20 MG tablet Take 20 mg by mouth daily.    metFORMIN (GLUCOPHAGE) 500 MG tablet Take 500 mg by mouth  daily.   tadalafil (CIALIS) 5 MG tablet Take 5 mg by mouth daily.    TRULICITY 0.75 MG/0.5ML SOPN SMARTSIG:0.5 Milliliter(s) SUB-Q Once a Week   colchicine 0.6 MG tablet Take 1 tablet (0.6 mg total) by mouth daily for 14 days.   fexofenadine (ALLEGRA) 180 MG tablet Take 180 mg by mouth daily. (Patient not taking: Reported on 12/17/2022)   montelukast (SINGULAIR) 10 MG tablet Take 10 mg by mouth daily. (Patient not taking: Reported on 12/17/2022)   Facility-Administered Encounter Medications as of 12/17/2022  Medication   triamcinolone acetonide (KENALOG) 10 MG/ML injection 10 mg    Past Medical History:  Diagnosis Date   Hypertension    Seizure ([facility]) AGE 23   X 1 OR 2 NONE SINCE   Sleep apnea     Past Surgical History:  Procedure Laterality Date   COLONOSCOPY WITH PROPOFOL N/A 01/05/2015   Procedure: COLONOSCOPY WITH PROPOFOL;  Surgeon: Jeani Hawking, MD;  Location: [facility];  Service: Endoscopy;  Laterality: N/A;   COLONOSCOPY WITH PROPOFOL N/A 11/04/2018   Procedure: COLONOSCOPY WITH PROPOFOL;  Surgeon: Charna Elizabeth, MD;  Location: [facility];  Service: Endoscopy;  Laterality: N/A;   HEMORRHOID SURGERY N/A 12/03/2018   Procedure: HEMORRHOIDECTOMY (2ND COLUMN);  Surgeon: Romie Levee, MD;  Location: [facility];  Service: General;  Laterality: N/A;    Family History  Problem Relation Age of Onset   Hypertension Mother     Social History   Socioeconomic  History   Marital status: Married    Spouse name: Not on file   Number of children: 2   Years of education: Not on file   Highest education level: Bachelor's degree (e.g., BA, AB, BS)  Occupational History   Not on file  Tobacco Use   Smoking status: Never   Smokeless tobacco: Never  Vaping Use   Vaping status: Never Used  Substance and Sexual Activity   Alcohol use: Yes    Comment: occasional   Drug use: No   Sexual activity: Not on file  Other Topics Concern   Not on file  Social History  Narrative   Not on file   Social Determinants of Health   Financial Resource Strain: Low Risk  (12/12/2022)   Overall Financial Resource Strain (CARDIA)    Difficulty of Paying Living Expenses: Not hard at all  Food Insecurity: No Food Insecurity (12/12/2022)   Hunger Vital Sign    Worried About Running Out of Food in the Last Year: Never true    Ran Out of Food in the Last Year: Never true  Transportation Needs: No Transportation Needs (12/12/2022)   PRAPARE - Administrator, Civil Service (Medical): No    Lack of Transportation (Non-Medical): No  Physical Activity: Sufficiently Active (12/12/2022)   Exercise Vital Sign    Days of Exercise per Week: 3 days    Minutes of Exercise per Session: 60 min  Stress: No Stress Concern Present (12/12/2022)   [employer] of Occupational Health - Occupational Stress Questionnaire    Feeling of Stress : Only a little  Social Connections: Socially Integrated (12/12/2022)   Social Connection and Isolation Panel [NHANES]    Frequency of Communication with Friends and Family: Three times a week    Frequency of Social Gatherings with Friends and Family: Once a week    Attends Religious Services: 1 to 4 times per year    Active Member of [employer] or Organizations: Yes    Attends Banker Meetings: 1 to 4 times per year    Marital Status: Married  Catering manager Violence: Not At Risk (12/17/2022)   Humiliation, Afraid, Rape, and Kick questionnaire    Fear of Current or Ex-Partner: No    Emotionally Abused: No    Physically Abused: No    Sexually Abused: No    Objective    BP 123/80 (BP Location: Left Arm, Patient Position: Sitting, Cuff Size: Normal)   Pulse 85   Temp 98.4 F (36.9 C)   Ht 5\' 10"  (1.778 m)   Wt 223 lb 11.2 oz (101.5 kg)   SpO2 98%   BMI 32.10 kg/m   Physical Exam  52 year old male in no acute distress Cardiovascular exam with regular rate and rhythm Lungs clear to auscultation bilaterally Left  shoulder: No tenderness over spinous processes through cervical spine.  There is some tenderness to palpation along left cervical paraspinal muscles extending laterally to left shoulder. Obvious swelling, bruising or erythema: absent Deformity of the shoulder: absent Active ROM: full range of motion Passive ROM: full range of motion Strength: normal/normal Empty can: Negative Hawkins: Negative Neer's: Negative Neurovascular exam: intact  Assessment & Plan:   Essential hypertension Assessment & Plan: Blood pressure appropriate in office today, can continue with current medication regimen, no changes made today Recommend intermittent monitoring of blood pressure at home, DASH diet   Prediabetes Assessment & Plan: Patient thinks most recent hemoglobin A1c was 6.1%.  Currently managing with metformin  and Trulicity.  Can continue with current medication regimen.  Would need to request records to review prior A1c and determine when to recheck hemoglobin A1c for monitoring   Left shoulder pain, unspecified chronicity Assessment & Plan: Feel that symptoms are more related to trapezius, normal shoulder exam in office today.  I do feel the patient would do well with conservative management.  He has been working with chiropractor which he can continue to do so if interested.  Also discussed recommendation for formal physical therapy and benefits which can be seen with this.  Patient is amenable to referral to PT, referral placed today  Orders: -     Ambulatory referral to Physical Therapy  Mixed hyperlipidemia Assessment & Plan: Currently tolerating statin therapy, can continue with medication at this time Results of recent lipid panel not available in our system, would consider recheck in the future   Encounter for immunization -     Flu vaccine trivalent PF, 6mos and older(Flulaval,Afluria,Fluarix,Fluzone)  Return in about 3 months (around 03/18/2023) for left shoulder.   Spent 46  minutes on this patient encounter, including preparation, chart review, face-to-face counseling with patient and coordination of care, and documentation of encounter    ___________________________________________ Joy Reiger de [place], MD, ABFM, [facility]

## 2022-12-26 NOTE — Telephone Encounter (Signed)
Appt was made for p/u

## 2022-12-30 ENCOUNTER — Other Ambulatory Visit: Payer: Self-pay

## 2022-12-30 ENCOUNTER — Encounter: Payer: Self-pay | Admitting: Physical Therapy

## 2022-12-30 ENCOUNTER — Ambulatory Visit: Payer: 59 | Attending: Family Medicine | Admitting: Physical Therapy

## 2022-12-30 DIAGNOSIS — M62838 Other muscle spasm: Secondary | ICD-10-CM | POA: Insufficient documentation

## 2022-12-30 DIAGNOSIS — M542 Cervicalgia: Secondary | ICD-10-CM | POA: Diagnosis not present

## 2022-12-30 DIAGNOSIS — M25512 Pain in left shoulder: Secondary | ICD-10-CM | POA: Diagnosis present

## 2022-12-30 DIAGNOSIS — Z7409 Other reduced mobility: Secondary | ICD-10-CM | POA: Insufficient documentation

## 2022-12-30 DIAGNOSIS — R293 Abnormal posture: Secondary | ICD-10-CM | POA: Diagnosis not present

## 2022-12-30 DIAGNOSIS — G8929 Other chronic pain: Secondary | ICD-10-CM

## 2022-12-30 NOTE — Therapy (Signed)
OUTPATIENT PHYSICAL THERAPY UPPER EXTREMITY EVALUATION   Patient Name: Terry York MRN: [id number] DOB:Apr 18, 1970, 52 y.o., male Today's Date: 12/30/2022  END OF SESSION:  PT End of Session - 12/30/22 1239     Visit Number 1    Authorization Type [employer]    PT Start Time 1102    PT Stop Time 1144    PT Time Calculation (min) 42 min    Activity Tolerance Patient tolerated treatment well    Behavior During Therapy WFL for tasks assessed/performed             Past Medical History:  Diagnosis Date   Hypertension    Seizure (HCC) AGE 45   X 1 OR 2 NONE SINCE   Sleep apnea    Past Surgical History:  Procedure Laterality Date   COLONOSCOPY WITH PROPOFOL N/A 01/05/2015   Procedure: COLONOSCOPY WITH PROPOFOL;  Surgeon: Jeani Hawking, MD;  Location: [facility];  Service: Endoscopy;  Laterality: N/A;   COLONOSCOPY WITH PROPOFOL N/A 11/04/2018   Procedure: COLONOSCOPY WITH PROPOFOL;  Surgeon: Charna Elizabeth, MD;  Location: [facility];  Service: Endoscopy;  Laterality: N/A;   HEMORRHOID SURGERY N/A 12/03/2018   Procedure: HEMORRHOIDECTOMY (2ND COLUMN);  Surgeon: Romie Levee, MD;  Location: [facility];  Service: General;  Laterality: N/A;   Patient Active Problem List   Diagnosis Date Noted   Prediabetes 12/17/2022   Left shoulder pain 12/17/2022   Skipped heart beats 12/18/2021   Palpitations 12/18/2021   Essential hypertension 12/18/2021   Mixed hyperlipidemia 12/18/2021   OSA (obstructive sleep apnea) 01/23/2021   Anal fissure 05/03/2020   Colon cancer screening 05/03/2020   Family history of malignant neoplasm of gastrointestinal tract 05/03/2020   Anorectal disorder 05/03/2020   Hemorrhage of anus and rectum 05/03/2020   Rectal bleeding 05/03/2020   Internal hemorrhoids 05/03/2020   Obesity 05/03/2020    PCP: de [place], Raymond J, MD  REFERRING PROVIDER: de [place], Raymond J, MD  REFERRING DIAG: [phone] (ICD-10-CM) - Left shoulder pain, unspecified  chronicity  THERAPY DIAG:  Chronic left shoulder pain  Other muscle spasm  Cervicalgia  Abnormal posture  Rationale for Evaluation and Treatment: Rehabilitation  ONSET DATE: 09/2022  SUBJECTIVE:                                                                                                                                                                                      SUBJECTIVE STATEMENT: 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 He goes over the road but it is is not continuous long haul trips. 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 He went to the chiropractor for a month and the treatment helped his symptoms. (TENS and joint manipulations). Chiropractic treatment helped but now after driving 2 hours he starts to feel the pain again. After four hours of driving the pain is more intense. He can drive normal sized vehicles fine without pain, but when driving commercial trucks he experiences pain. Pain does not disturb his sleep. Patient denies numbness and tingling in his fingers and headaches. Sometimes he will experience numbness in his upper trap area.  Hand dominance: Right  PERTINENT HISTORY: HTN, sleep apnea, Hx of seizures- none since age 28  PAIN:  Are you having pain? Yes: NPRS scale: 1(currently) 8-9 (worst) /10 Pain location: Lt upper trap area up into Lt side of neck; Lt posterior shoulder capsule Pain description: dull then as the day progresses it gets sharp Aggravating factors: Looking in blind spots (both directions); looking down Relieving factors: Massage gun; pain patch before work  PRECAUTIONS: None  RED FLAGS: None   WEIGHT BEARING RESTRICTIONS: No  FALLS:  Has patient fallen in last 6 months? 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 Holds steering wheel at 9 & 3.    UPPER EXTREMITY ROM: A/ROM & PROM WFL. With Lt shoulder flexion PROM noted soft tissue stretch end feel.  Cervical ROM : WFL; some discomfort with cervical extension and bilateral cervical rotation  UPPER EXTREMITY MMT:  MMT Right eval Left eval  Shoulder flexion 5 5  Shoulder extension    Shoulder abduction 5 5  Shoulder adduction    Shoulder internal rotation Reached to inferior border of scapula Reached to inferior border of scapula  Shoulder external rotation Reached to spine of scapula Reached to spine of scapula  Middle trapezius 4 4  Lower trapezius 4 4  Elbow flexion 5 5  Elbow extension 5 5  Wrist flexion    Wrist extension    Wrist ulnar deviation    Wrist radial deviation    Wrist pronation    Wrist supination    Grip strength (lbs)    (Blank rows = not tested)   JOINT MOBILITY TESTING:  Good mobility for C2-C8; Decreased mobility of T1-T3  PALPATION:  Palpable taut bands and tenderness of Lt upper trap and levator scapulae    TODAY'S TREATMENT:  DATE:  12/30/2022: HEP established  PATIENT EDUCATION: Education details: Shoulder mobility, DN handout Person educated: Patient Education method: Explanation, Demonstration, and Handouts Education comprehension:  verbalized understanding, returned demonstration, and needs further education  HOME EXERCISE PROGRAM: Access Code: [email or web address] URL: [email or web address] Date: 12/30/2022 Prepared by: Claude Manges  Exercises - Doorway Pec Stretch at 90 Degrees Abduction  - 1 x daily - 7 x weekly - 3 sets - 10 reps - Seated Upper Trapezius Stretch  - 1 x daily - 7 x weekly - 3 sets - 10 reps - Seated Levator Scapulae Stretch  - 1 x daily - 7 x weekly - 3 sets - 10 reps - Standing Shoulder Posterior Capsule Stretch  - 1 x daily - 7 x weekly - 3 sets - 10 reps  ASSESSMENT:  CLINICAL IMPRESSION: Patient is a 52 y.o. male who was seen today for physical therapy evaluation and treatment for left shoulder pain. Based on assessment noted poor seated posture and decreased strength of bilateral lower and middle trap. Due to patient's pain he is unable to make it through a full work day without experiencing pain in his Lt upper trap and Lt side of his neck. Educated patient on the benefits of dry needling and proper seated posture when driving commercial trucks. Noted increased muscle spasms of Lt upper trap and levator scapulae. Patient will benefit from skilled PT to address the below impairments and improve overall function.    OBJECTIVE IMPAIRMENTS: decreased strength, increased muscle spasms, postural dysfunction, and pain.   ACTIVITY LIMITATIONS: sitting and reach over head  PARTICIPATION LIMITATIONS: driving, community activity, and occupation  PERSONAL FACTORS: 1 comorbidity: HTN  are also affecting patient's functional outcome.   REHAB POTENTIAL: Good  CLINICAL DECISION MAKING: Stable/uncomplicated  EVALUATION COMPLEXITY: Low  GOALS: Goals reviewed with patient? Yes  SHORT TERM GOALS: Target date: 01/27/2023  Patient will be independent with initial HEP. Baseline:  Goal status: INITIAL  2.  Patient will demonstrate correct seated posture while driving. Baseline:  Goal status:  INITIAL   3.  Patient will be able to drive for > or = to 2.5 hours before onset of Lt shoulder and neck pain. Baseline: 2 hours Goal status: INITIAL   LONG TERM GOALS: Target date: 02/24/2023 Patient will demonstrate independence in advanced HEP. Baseline:  Goal status: INITIAL  2.  Patient will report < or = to 5/10 pain in Lt neck and shoulder at end of work day. Baseline: 8-9/10 Goal status: INITIAL   3.  Patient's FOTO score will improve to > or = to 79 for improved function. Baseline: 59 Goal status: INITIAL  4.  Patient will be able to drive for > or = to 3 hours before onset of Lt shoulder and neck pain. Baseline: 2 hours Goal status: INITIAL   PLAN: PT FREQUENCY: 2x/week  PT DURATION: 8 weeks  PLANNED INTERVENTIONS: Therapeutic exercises, Therapeutic activity, Neuromuscular re-education, Balance training, Gait training, Patient/Family education, Self Care, Joint mobilization, Joint manipulation, Stair training, Vestibular training, Canalith repositioning, Aquatic Therapy, Dry Needling, Electrical stimulation, Spinal manipulation, Spinal mobilization, Cryotherapy, Moist heat, Compression bandaging, Taping, Traction, Ultrasound, Ionotophoresis 4mg /ml Dexamethasone, and Manual therapy  PLAN FOR NEXT SESSION: Assess HEP; Postural strengthening; DN (performed by another therapist)/ Manual if needed   Claude Manges, PT 12/30/22 12:41 PM  [facility] [address] [place], [place] [address] Phone # [phone] Fax [phone]

## 2023-01-02 ENCOUNTER — Encounter: Payer: 59 | Admitting: Physical Therapy

## 2023-01-06 ENCOUNTER — Encounter: Payer: Self-pay | Admitting: Physical Therapy

## 2023-01-06 ENCOUNTER — Ambulatory Visit: Payer: 59 | Attending: Family Medicine | Admitting: Physical Therapy

## 2023-01-06 ENCOUNTER — Telehealth: Payer: Self-pay

## 2023-01-06 DIAGNOSIS — M25512 Pain in left shoulder: Secondary | ICD-10-CM | POA: Insufficient documentation

## 2023-01-06 DIAGNOSIS — R293 Abnormal posture: Secondary | ICD-10-CM | POA: Insufficient documentation

## 2023-01-06 DIAGNOSIS — M62838 Other muscle spasm: Secondary | ICD-10-CM | POA: Diagnosis present

## 2023-01-06 DIAGNOSIS — M542 Cervicalgia: Secondary | ICD-10-CM | POA: Insufficient documentation

## 2023-01-06 DIAGNOSIS — G8929 Other chronic pain: Secondary | ICD-10-CM | POA: Insufficient documentation

## 2023-01-06 NOTE — Therapy (Signed)
OUTPATIENT PHYSICAL THERAPY UPPER EXTREMITY TREATMENT   Patient Name: Terry York MRN: [id number] DOB:30-Apr-1970, 52 y.o., male Today's Date: 01/06/2023  END OF SESSION:  PT End of Session - 01/06/23 0936     Visit Number 2    Authorization Type [employer]    PT Start Time [phone]    PT Stop Time 0931    PT Time Calculation (min) 38 min    Activity Tolerance Patient tolerated treatment well    Behavior During Therapy WFL for tasks assessed/performed              Past Medical History:  Diagnosis Date   Hypertension    Seizure (HCC) AGE 41   X 1 OR 2 NONE SINCE   Sleep apnea    Past Surgical History:  Procedure Laterality Date   COLONOSCOPY WITH PROPOFOL N/A 01/05/2015   Procedure: COLONOSCOPY WITH PROPOFOL;  Surgeon: Jeani Hawking, MD;  Location: [facility];  Service: Endoscopy;  Laterality: N/A;   COLONOSCOPY WITH PROPOFOL N/A 11/04/2018   Procedure: COLONOSCOPY WITH PROPOFOL;  Surgeon: Charna Elizabeth, MD;  Location: [facility];  Service: Endoscopy;  Laterality: N/A;   HEMORRHOID SURGERY N/A 12/03/2018   Procedure: HEMORRHOIDECTOMY (2ND COLUMN);  Surgeon: Romie Levee, MD;  Location: [facility];  Service: General;  Laterality: N/A;   Patient Active Problem List   Diagnosis Date Noted   Prediabetes 12/17/2022   Left shoulder pain 12/17/2022   Skipped heart beats 12/18/2021   Palpitations 12/18/2021   Essential hypertension 12/18/2021   Mixed hyperlipidemia 12/18/2021   OSA (obstructive sleep apnea) 01/23/2021   Anal fissure 05/03/2020   Colon cancer screening 05/03/2020   Family history of malignant neoplasm of gastrointestinal tract 05/03/2020   Anorectal disorder 05/03/2020   Hemorrhage of anus and rectum 05/03/2020   Rectal bleeding 05/03/2020   Internal hemorrhoids 05/03/2020   Obesity 05/03/2020    PCP: de [place], Raymond J, MD  REFERRING PROVIDER: de [place], Raymond J, MD  REFERRING DIAG: [phone] (ICD-10-CM) - Left shoulder pain, unspecified  chronicity  THERAPY DIAG:  Chronic left shoulder pain  Other muscle spasm  Cervicalgia  Abnormal posture  Rationale for Evaluation and Treatment: Rehabilitation  ONSET DATE: 09/2022  SUBJECTIVE:                                                                                                                                                                                      SUBJECTIVE STATEMENT: Patient reports he is doing well today. His neck and shoulders are feeling a lot looser. He drove over the road 12/31/2022 and he reported a decrease in shoulder and neck pain. Verbalized scapular retraction exercise was  very helpful to perform in the truck to alleviate pain.  Hand dominance: Right  PERTINENT HISTORY: HTN, sleep apnea, Hx of seizures- none since age 59  PAIN: 01/06/2023 Are you having pain? Yes: NPRS scale: 2/10 Pain location: Lt upper trap area up into Lt side of neck; Lt posterior shoulder capsule Pain description: dull then as the day progresses it gets sharp Aggravating factors: Looking in blind spots (both directions); looking down Relieving factors: Massage gun; pain patch before work  PRECAUTIONS: None  RED FLAGS: None   WEIGHT BEARING RESTRICTIONS: No  FALLS:  Has patient fallen in last 6 months? 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 Holds steering wheel at 9 & 3.    UPPER EXTREMITY ROM: A/ROM & PROM WFL. With Lt  shoulder flexion PROM noted soft tissue stretch end feel.  Cervical ROM : WFL; some discomfort with cervical extension and bilateral cervical rotation  UPPER EXTREMITY MMT:  MMT Right eval Left eval  Shoulder flexion 5 5  Shoulder extension    Shoulder abduction 5 5  Shoulder adduction    Shoulder internal rotation Reached to inferior border of scapula Reached to inferior border of scapula  Shoulder external rotation Reached to spine of scapula Reached to spine of scapula  Middle trapezius 4 4  Lower trapezius 4 4  Elbow flexion 5 5  Elbow extension 5 5  Wrist flexion    Wrist extension    Wrist ulnar deviation    Wrist radial deviation    Wrist pronation    Wrist supination    Grip strength (lbs)    (Blank rows = not tested)   JOINT MOBILITY TESTING:  Good mobility for C2-C8; Decreased mobility of T1-T3  PALPATION:  Palpable taut bands and tenderness of Lt upper trap and levator scapulae    TODAY'S TREATMENT:                                                                                                                                         DATE:  01/06/2023 Reviewed HEP W's and Y's at wall 2 x 10  Rows at cable column 2 x 10 17 lbs Shoulder Extension at cable column 2x10 17 lbs  Cervical MELT (flexion/ extension; rotation) x 15 Seated Chin Tucks 2 x 8 Manual : Grade III C8-T4    12/30/2022: HEP established  PATIENT EDUCATION: Education details:  Shoulder mobility, DN handout Person educated: Patient Education method: Explanation, Demonstration, and Handouts Education comprehension: verbalized understanding, returned demonstration, and needs further education  HOME EXERCISE PROGRAM: Access Code: [email or web address] URL: [email or web address] Date: 12/30/2022 Prepared by: Claude Manges  Exercises - Doorway Pec Stretch at 90 Degrees Abduction  - 1 x daily - 7 x weekly - 3 sets - 10 reps - Seated Upper Trapezius Stretch  - 1 x daily - 7 x weekly - 3 sets -  10 reps - Seated Levator Scapulae Stretch  - 1 x daily - 7 x weekly - 3 sets - 10 reps - Standing Shoulder Posterior Capsule Stretch  - 1 x daily - 7 x weekly - 3 sets - 10 reps  ASSESSMENT:  CLINICAL IMPRESSION: Today's treatment session focused on cervical mobility and postural strengthening. Review patient's HEP exercises and he required minimal verbal cues for form corrections. Incorporated postural strengthening exercises and patient tolerated treatment session well and did not verbalize any increased pain. Patient responded favorably to manual therapy and verbalized feeling good. Patient will benefit from skilled PT to address the below impairments and improve overall function.     OBJECTIVE IMPAIRMENTS: decreased strength, increased muscle spasms, postural dysfunction, and pain.   ACTIVITY LIMITATIONS: sitting and reach over head  PARTICIPATION LIMITATIONS: driving, community activity, and occupation  PERSONAL FACTORS: 1 comorbidity: HTN  are also affecting patient's functional outcome.   REHAB POTENTIAL: Good  CLINICAL DECISION MAKING: Stable/uncomplicated  EVALUATION COMPLEXITY: Low  GOALS: Goals reviewed with patient? Yes  SHORT TERM GOALS: Target date: 01/27/2023  Patient will be independent with initial HEP. Baseline:  Goal status: INITIAL  2.  Patient will demonstrate correct seated posture while driving. Baseline:  Goal status: INITIAL   3.  Patient will be able to drive for > or = to 2.5 hours before onset of Lt shoulder and neck pain. Baseline: 2 hours Goal status: INITIAL   LONG TERM GOALS: Target date: 02/24/2023 Patient will demonstrate independence in advanced HEP. Baseline:  Goal status: INITIAL  2.  Patient will report < or = to 5/10 pain in Lt neck and shoulder at end of work day. Baseline: 8-9/10 Goal status: INITIAL   3.  Patient's FOTO score will improve to > or = to 79 for improved function. Baseline: 59 Goal status: INITIAL  4.   Patient will be able to drive for > or = to 3 hours before onset of Lt shoulder and neck pain. Baseline: 2 hours Goal status: INITIAL   PLAN: PT FREQUENCY: 2x/week  PT DURATION: 8 weeks  PLANNED INTERVENTIONS: Therapeutic exercises, Therapeutic activity, Neuromuscular re-education, Balance training, Gait training, Patient/Family education, Self Care, Joint mobilization, Joint manipulation, Stair training, Vestibular training, Canalith repositioning, Aquatic Therapy, Dry Needling, Electrical stimulation, Spinal manipulation, Spinal mobilization, Cryotherapy, Moist heat, Compression bandaging, Taping, Traction, Ultrasound, Ionotophoresis 4mg /ml Dexamethasone, and Manual therapy  PLAN FOR NEXT SESSION: Continue postural strengthening; manual as needed; DN (to be performed by another therapist   Claude Manges, PT 01/06/23 9:37 AM  [facility] [address], [address] [place], [place] [address] Phone # [phone] Fax [phone]

## 2023-01-06 NOTE — Telephone Encounter (Signed)
Entered in error

## 2023-01-08 ENCOUNTER — Encounter: Payer: Self-pay | Admitting: Physical Therapy

## 2023-01-08 ENCOUNTER — Ambulatory Visit: Payer: 59 | Admitting: Physical Therapy

## 2023-01-08 DIAGNOSIS — M25512 Pain in left shoulder: Secondary | ICD-10-CM | POA: Diagnosis not present

## 2023-01-08 DIAGNOSIS — M62838 Other muscle spasm: Secondary | ICD-10-CM

## 2023-01-08 DIAGNOSIS — G8929 Other chronic pain: Secondary | ICD-10-CM

## 2023-01-08 DIAGNOSIS — R293 Abnormal posture: Secondary | ICD-10-CM

## 2023-01-08 DIAGNOSIS — M542 Cervicalgia: Secondary | ICD-10-CM

## 2023-01-08 NOTE — Therapy (Signed)
OUTPATIENT PHYSICAL THERAPY UPPER EXTREMITY TREATMENT   Patient Name: Terry York MRN: [id number] DOB:06/28/1970, 52 y.o., male Today's Date: 01/08/2023  END OF SESSION:  PT End of Session - 01/08/23 1317     Visit Number 3    Authorization Type [employer]    PT Start Time 1225    PT Stop Time 1310    PT Time Calculation (min) 45 min    Activity Tolerance Patient tolerated treatment well    Behavior During Therapy WFL for tasks assessed/performed               Past Medical History:  Diagnosis Date   Hypertension    Seizure (HCC) AGE 35   X 1 OR 2 NONE SINCE   Sleep apnea    Past Surgical History:  Procedure Laterality Date   COLONOSCOPY WITH PROPOFOL N/A 01/05/2015   Procedure: COLONOSCOPY WITH PROPOFOL;  Surgeon: Jeani Hawking, MD;  Location: [facility];  Service: Endoscopy;  Laterality: N/A;   COLONOSCOPY WITH PROPOFOL N/A 11/04/2018   Procedure: COLONOSCOPY WITH PROPOFOL;  Surgeon: Charna Elizabeth, MD;  Location: [facility];  Service: Endoscopy;  Laterality: N/A;   HEMORRHOID SURGERY N/A 12/03/2018   Procedure: HEMORRHOIDECTOMY (2ND COLUMN);  Surgeon: Romie Levee, MD;  Location: [facility];  Service: General;  Laterality: N/A;   Patient Active Problem List   Diagnosis Date Noted   Prediabetes 12/17/2022   Left shoulder pain 12/17/2022   Skipped heart beats 12/18/2021   Palpitations 12/18/2021   Essential hypertension 12/18/2021   Mixed hyperlipidemia 12/18/2021   OSA (obstructive sleep apnea) 01/23/2021   Anal fissure 05/03/2020   Colon cancer screening 05/03/2020   Family history of malignant neoplasm of gastrointestinal tract 05/03/2020   Anorectal disorder 05/03/2020   Hemorrhage of anus and rectum 05/03/2020   Rectal bleeding 05/03/2020   Internal hemorrhoids 05/03/2020   Obesity 05/03/2020    PCP: de [place], Raymond J, MD  REFERRING PROVIDER: de [place], Raymond J, MD  REFERRING DIAG: [phone] (ICD-10-CM) - Left shoulder pain, unspecified  chronicity  THERAPY DIAG:  Chronic left shoulder pain  Other muscle spasm  Cervicalgia  Abnormal posture  Rationale for Evaluation and Treatment: Rehabilitation  ONSET DATE: 09/2022  SUBJECTIVE:                                                                                                                                                                                      SUBJECTIVE STATEMENT: Patient reports he is doing well today. His neck and and shoulder is not bothering him as much 1/10 pain.  Hand dominance: Right  PERTINENT HISTORY: HTN, sleep apnea, Hx of seizures- none since age  10  PAIN: 01/06/2023 Are you having pain? Yes: NPRS scale: 2/10 Pain location: Lt upper trap area up into Lt side of neck; Lt posterior shoulder capsule Pain description: dull then as the day progresses it gets sharp Aggravating factors: Looking in blind spots (both directions); looking down Relieving factors: Massage gun; pain patch before work  PRECAUTIONS: None  RED FLAGS: None   WEIGHT BEARING RESTRICTIONS: No  FALLS:  Has patient fallen in last 6 months? 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 Holds steering wheel at 9 & 3.    UPPER EXTREMITY ROM: A/ROM & PROM WFL. With Lt shoulder flexion PROM noted soft tissue stretch end feel.  Cervical ROM : WFL; some discomfort with cervical extension and bilateral cervical rotation  UPPER EXTREMITY  MMT:  MMT Right eval Left eval  Shoulder flexion 5 5  Shoulder extension    Shoulder abduction 5 5  Shoulder adduction    Shoulder internal rotation Reached to inferior border of scapula Reached to inferior border of scapula  Shoulder external rotation Reached to spine of scapula Reached to spine of scapula  Middle trapezius 4 4  Lower trapezius 4 4  Elbow flexion 5 5  Elbow extension 5 5  Wrist flexion    Wrist extension    Wrist ulnar deviation    Wrist radial deviation    Wrist pronation    Wrist supination    Grip strength (lbs)    (Blank rows = not tested)   JOINT MOBILITY TESTING:  Good mobility for C2-C8; Decreased mobility of T1-T3  PALPATION:  Palpable taut bands and tenderness of Lt upper trap and levator scapulae    TODAY'S TREATMENT:                                                                                                                                         DATE:  01/08/2023 UBE 6 mins (3 forward/ 3 backwards) Y's at Wall 2 x 10 Seated Chin Tucks 2 x 8 Standing Shoulder ER 2 x 10 Red TB Shoulder Abduction 2 x 10 Red TB Shoulder Rows 2 x 10 Red TB Shoulder Extension 2 x 10 Red TB Standing D2 Flexion 2x10 each Bent Over I's Y's T's 2# DB 2 x 10 Seated Rows at matrix 25# 2x10 Manual: Grade III C8-T4   01/06/2023 Reviewed HEP W's and Y's at wall 2  x 10  Rows at cable column 2 x 10 17 lbs Shoulder Extension at cable column 2x10 17 lbs  Cervical MELT (flexion/ extension; rotation) x 15 Seated Chin Tucks 2 x 8 Manual : Grade III C8-T4    12/30/2022: HEP established  PATIENT EDUCATION: Education details: Shoulder mobility, DN handout Person educated: Patient Education method: Explanation, Demonstration, and Handouts Education comprehension: verbalized understanding, returned demonstration, and needs further education  HOME EXERCISE PROGRAM: Access Code: [email or web address] URL: [email or web address] Date: 01/08/2023 Prepared by: Claude Manges  Exercises - Seated March  - 1 x daily - 7 x weekly - 1 sets - 10 reps - Seated Long Arc Quad  - 1 x daily - 7 x weekly - 1 sets - 10 reps - 2 hold - Seated Hip Abduction with Resistance  - 1 x daily - 7 x weekly - 1 sets - 10 reps - Seated Hip Adduction Isometrics with Ball  - 1 x daily - 7 x weekly - 1 sets - 10 reps - Standing Marching  - 1 x daily - 7 x weekly - 1 sets - 10 reps - Standing Hip Abduction  - 1 x daily - 7 x weekly - 1 sets - 10 reps - Standing Hip Extension with Counter Support  - 1 x daily - 7 x weekly - 1 sets - 10 reps - Shoulder External Rotation and Scapular Retraction with Resistance  - 1 x daily - 7 x weekly - 2 sets - 10 reps - Standing Shoulder Row with Anchored Resistance  - 1 x daily - 7 x weekly - 2 sets - 10 reps - Standing Shoulder Horizontal Abduction with Resistance  - 1 x daily - 7 x weekly - 2 sets - 10 reps - Shoulder extension with resistance - Neutral  - 1 x daily - 7 x weekly - 2 sets - 10 reps  ASSESSMENT:  CLINICAL IMPRESSION: Today's treatment session focused on postural and cervical strengthening. Updated patient's HEP to include postural strengthening exercises. Patient required moderated verbal cues for form correction and proper muscle engagement. With standing I's, Y's T's patient required verbal cue's for proper neck and spine alignment. Patient tolerated treatment session well and did not verbalize any increased pain. Patient will benefit from skilled PT to address the below impairments and improve overall function.   OBJECTIVE IMPAIRMENTS: decreased strength, increased muscle spasms, postural dysfunction, and pain.   ACTIVITY LIMITATIONS: sitting and reach over head  PARTICIPATION LIMITATIONS: driving, community activity, and occupation  PERSONAL FACTORS: 1 comorbidity: HTN  are also affecting patient's functional outcome.   REHAB POTENTIAL: Good  CLINICAL DECISION MAKING: Stable/uncomplicated  EVALUATION COMPLEXITY:  Low  GOALS: Goals reviewed with patient? Yes  SHORT TERM GOALS: Target date: 01/27/2023  Patient will be independent with initial HEP. Baseline:  Goal status: INITIAL  2.  Patient will demonstrate correct seated posture while driving. Baseline:  Goal status: INITIAL   3.  Patient will be able to drive for > or = to 2.5 hours before onset of Lt shoulder and neck pain. Baseline: 2 hours Goal status: INITIAL   LONG TERM GOALS: Target date: 02/24/2023 Patient will demonstrate independence in advanced HEP. Baseline:  Goal status: INITIAL  2.  Patient will report < or = to 5/10 pain in Lt neck and shoulder at end of work day. Baseline: 8-9/10 Goal status: INITIAL   3.  Patient's FOTO score will improve to > or = to 79 for improved function. Baseline: 59 Goal  status: INITIAL  4.  Patient will be able to drive for > or = to 3 hours before onset of Lt shoulder and neck pain. Baseline: 2 hours Goal status: INITIAL   PLAN: PT FREQUENCY: 2x/week  PT DURATION: 8 weeks  PLANNED INTERVENTIONS: Therapeutic exercises, Therapeutic activity, Neuromuscular re-education, Balance training, Gait training, Patient/Family education, Self Care, Joint mobilization, Joint manipulation, Stair training, Vestibular training, Canalith repositioning, Aquatic Therapy, Dry Needling, Electrical stimulation, Spinal manipulation, Spinal mobilization, Cryotherapy, Moist heat, Compression bandaging, Taping, Traction, Ultrasound, Ionotophoresis 4mg /ml Dexamethasone, and Manual therapy  PLAN FOR NEXT SESSION: DN next treatment session   Claude Manges, PT 01/08/23 1:18 PM   [facility] [address] [place], [place] [address] Phone # [phone] Fax [phone]

## 2023-01-15 ENCOUNTER — Ambulatory Visit: Payer: 59

## 2023-01-15 DIAGNOSIS — M542 Cervicalgia: Secondary | ICD-10-CM

## 2023-01-15 DIAGNOSIS — M25512 Pain in left shoulder: Secondary | ICD-10-CM | POA: Diagnosis not present

## 2023-01-15 DIAGNOSIS — R293 Abnormal posture: Secondary | ICD-10-CM

## 2023-01-15 DIAGNOSIS — G8929 Other chronic pain: Secondary | ICD-10-CM

## 2023-01-15 DIAGNOSIS — M62838 Other muscle spasm: Secondary | ICD-10-CM

## 2023-01-15 NOTE — Therapy (Signed)
OUTPATIENT PHYSICAL THERAPY UPPER EXTREMITY TREATMENT   Patient Name: Terry York MRN: [id number] DOB:[birth year], 52 y.o., male Today's Date: 01/15/2023  END OF SESSION:  PT End of Session - 01/15/23 1229     Visit Number 4    Date for PT Re-Evaluation 02/24/23    Authorization Type [employer]    PT Start Time 1146    PT Stop Time 1228    PT Time Calculation (min) 42 min    Activity Tolerance Patient tolerated treatment well    Behavior During Therapy WFL for tasks assessed/performed                Past Medical History:  Diagnosis Date   Hypertension    Seizure (HCC) AGE 33   X 1 OR 2 NONE SINCE   Sleep apnea    Past Surgical History:  Procedure Laterality Date   COLONOSCOPY WITH PROPOFOL N/A 01/05/2015   Procedure: COLONOSCOPY WITH PROPOFOL;  Surgeon: Jeani Hawking, MD;  Location: [facility];  Service: Endoscopy;  Laterality: N/A;   COLONOSCOPY WITH PROPOFOL N/A 11/04/2018   Procedure: COLONOSCOPY WITH PROPOFOL;  Surgeon: Charna Elizabeth, MD;  Location: [facility];  Service: Endoscopy;  Laterality: N/A;   HEMORRHOID SURGERY N/A 12/03/2018   Procedure: HEMORRHOIDECTOMY (2ND COLUMN);  Surgeon: Romie Levee, MD;  Location: [facility];  Service: General;  Laterality: N/A;   Patient Active Problem List   Diagnosis Date Noted   Prediabetes 12/17/2022   Left shoulder pain 12/17/2022   Skipped heart beats 12/18/2021   Palpitations 12/18/2021   Essential hypertension 12/18/2021   Mixed hyperlipidemia 12/18/2021   OSA (obstructive sleep apnea) 01/23/2021   Anal fissure 05/03/2020   Colon cancer screening 05/03/2020   Family history of malignant neoplasm of gastrointestinal tract 05/03/2020   Anorectal disorder 05/03/2020   Hemorrhage of anus and rectum 05/03/2020   Rectal bleeding 05/03/2020   Internal hemorrhoids 05/03/2020   Obesity 05/03/2020    PCP: de [place], Raymond J, MD  REFERRING PROVIDER: de [place], Raymond J, MD  REFERRING DIAG: [phone]  (ICD-10-CM) - Left shoulder pain, unspecified chronicity  THERAPY DIAG:  Chronic left shoulder pain  Other muscle spasm  Cervicalgia  Abnormal posture  Rationale for Evaluation and Treatment: Rehabilitation  ONSET DATE: 09/2022  SUBJECTIVE:                                                                                                                                                                                      SUBJECTIVE STATEMENT: I feel like I have been getting better.  More ROM and aching less.  I am 45-50% better overall.    Hand dominance: Right  PERTINENT HISTORY: HTN, sleep apnea, Hx of seizures- none since age 54  PAIN: 01/06/2023 Are you having pain? Yes: NPRS scale: 2/10 Pain location: Lt upper trap area up into Lt side of neck; Lt posterior shoulder capsule Pain description: dull then as the day progresses it gets sharp Aggravating factors: Looking in blind spots (both directions); looking down Relieving factors: Massage gun; pain patch before work  PRECAUTIONS: None  RED FLAGS: None   WEIGHT BEARING RESTRICTIONS: No  FALLS:  Has patient fallen in last 6 months? 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 Holds steering wheel at 9 & 3.    UPPER EXTREMITY ROM: A/ROM & PROM WFL. With Lt shoulder flexion PROM noted soft tissue stretch end feel.  Cervical ROM : WFL; some discomfort with cervical extension and bilateral  cervical rotation  UPPER EXTREMITY MMT:  MMT Right eval Left eval  Shoulder flexion 5 5  Shoulder extension    Shoulder abduction 5 5  Shoulder adduction    Shoulder internal rotation Reached to inferior border of scapula Reached to inferior border of scapula  Shoulder external rotation Reached to spine of scapula Reached to spine of scapula  Middle trapezius 4 4  Lower trapezius 4 4  Elbow flexion 5 5  Elbow extension 5 5  Wrist flexion    Wrist extension    Wrist ulnar deviation    Wrist radial deviation    Wrist pronation    Wrist supination    Grip strength (lbs)    (Blank rows = not tested)   JOINT MOBILITY TESTING:  Good mobility for C2-C8; Decreased mobility of T1-T3  PALPATION:  Palpable taut bands and tenderness of Lt upper trap and levator scapulae    TODAY'S TREATMENT:                                                                                                                                         DATE:  01/15/2023 UBE 6 mins (3 forward/ 3 backwards) Standing Shoulder ER 2 x 10 Red TB Shoulder Abduction 2 x 10 Red TB Shoulder Rows 2 x 10 Red TB Shoulder Extension 2 x 10 Red TB Trigger Point Dry-Needling  Treatment instructions: Expect mild to moderate muscle soreness. S/S of pneumothorax if dry needled over a lung field, and to seek immediate medical attention should they occur. Patient verbalized understanding of these instructions  and education.  Patient Consent Given: Yes Education handout provided: Previously provided Muscles treated: Bil cervical mutifidi, T1-4 multifidi, bil upper traps and Lt rhomboids Treatment response/outcome: Utilized skilled palpation to identify trigger points.  During dry needling able to palpate muscle twitch and muscle elongation  Elongation and release to bil neck and Lt UT  Skilled palpation and monitoring by PT during dry needling  01/08/2023 UBE 6 mins (3 forward/ 3 backwards) Y's at Wall 2 x 10 Seated Chin Tucks  2 x 8 Standing Shoulder ER 2 x 10 Red TB Shoulder Abduction 2 x 10 Red TB Shoulder Rows 2 x 10 Red TB Shoulder Extension 2 x 10 Red TB Standing D2 Flexion 2x10 each Bent Over I's Y's T's 2# DB 2 x 10 Seated Rows at matrix 25# 2x10 Manual: Grade III C8-T4   01/06/2023 Reviewed HEP W's and Y's at wall 2 x 10  Rows at cable column 2 x 10 17 lbs Shoulder Extension at cable column 2x10 17 lbs  Cervical MELT (flexion/ extension; rotation) x 15 Seated Chin Tucks 2 x 8 Manual : Grade III C8-T4     PATIENT EDUCATION: Education details: Shoulder mobility, DN handout Person educated: Patient Education method: Explanation, Demonstration, and Handouts Education comprehension: verbalized understanding, returned demonstration, and needs further education  HOME EXERCISE PROGRAM: Access Code: [email or web address] URL: [email or web address] Date: 01/15/2023 Prepared by: Tresa Endo  Exercises - Scapular Retraction with Resistance  - 2 x daily - 7 x weekly - 2 sets - 10 reps - Shoulder extension with resistance - Neutral  - 2 x daily - 7 x weekly - 2 sets - 10 reps - Shoulder External Rotation and Scapular Retraction with Resistance  - 2 x daily - 7 x weekly - 2 sets - 10 reps - Seated Shoulder Horizontal Abduction with Resistance - Thumbs Up  - 2 x daily - 7 x weekly - 2 sets - 10 reps  ASSESSMENT:  CLINICAL IMPRESSION: Pt reports 45-50% overall pain reduction since the start of care.  Pt has been independent and compliant in his HEP and reports that he can now drive 3.5 hours without increased pain.  Pt had good response to DN with multiple twitch responses and improved tissue mobility at the end of session.  Patient will benefit from skilled PT to address the below impairments and improve overall function.   OBJECTIVE IMPAIRMENTS: decreased strength, increased muscle spasms, postural dysfunction, and pain.   ACTIVITY LIMITATIONS: sitting and reach over head  PARTICIPATION LIMITATIONS:  driving, community activity, and occupation  PERSONAL FACTORS: 1 comorbidity: HTN  are also affecting patient's functional outcome.   REHAB POTENTIAL: Good  CLINICAL DECISION MAKING: Stable/uncomplicated  EVALUATION COMPLEXITY: Low  GOALS: Goals reviewed with patient? Yes  SHORT TERM GOALS: Target date: 01/27/2023  Patient will be independent with initial HEP. Baseline:  Goal status: MET  2.  Patient will demonstrate correct seated posture while driving. Baseline:  Goal status: MET   3.  Patient will be able to drive for > or = to 2.5 hours before onset of Lt shoulder and neck pain. Baseline: 3.5 hours  (01/15/23) Goal status: MET   LONG TERM GOALS: Target date: 02/24/2023 Patient will demonstrate independence in advanced HEP. Baseline:  Goal status: INITIAL  2.  Patient will report < or = to 5/10 pain in Lt neck and shoulder at end of work day. Baseline: 8-9/10 Goal status: INITIAL   3.  Patient's FOTO score will improve to > or = to 79  for improved function. Baseline: 59 Goal status: INITIAL  4.  Patient will be able to drive for > or = to 3 hours before onset of Lt shoulder and neck pain. Baseline: 2 hours Goal status: INITIAL   PLAN: PT FREQUENCY: 2x/week  PT DURATION: 8 weeks  PLANNED INTERVENTIONS: Therapeutic exercises, Therapeutic activity, Neuromuscular re-education, Balance training, Gait training, Patient/Family education, Self Care, Joint mobilization, Joint manipulation, Stair training, Vestibular training, Canalith repositioning, Aquatic Therapy, Dry Needling, Electrical stimulation, Spinal manipulation, Spinal mobilization, Cryotherapy, Moist heat, Compression bandaging, Taping, Traction, Ultrasound, Ionotophoresis 4mg /ml Dexamethasone, and Manual therapy  PLAN FOR NEXT SESSION: assess response to DN, continue cervicothoracic mobility and postural strength. Thoracic mobility: cat/cow, childs pose, thread the needle.    Lorrene Reid,  PT 01/15/23 12:33 PM    [facility] [address], [address] [place], [place] [address] Phone # [phone] Fax [phone]

## 2023-01-19 ENCOUNTER — Encounter: Payer: Self-pay | Admitting: Physical Therapy

## 2023-01-19 ENCOUNTER — Ambulatory Visit: Payer: 59 | Admitting: Physical Therapy

## 2023-01-19 DIAGNOSIS — M62838 Other muscle spasm: Secondary | ICD-10-CM

## 2023-01-19 DIAGNOSIS — M25512 Pain in left shoulder: Secondary | ICD-10-CM | POA: Diagnosis not present

## 2023-01-19 DIAGNOSIS — M542 Cervicalgia: Secondary | ICD-10-CM

## 2023-01-19 DIAGNOSIS — G8929 Other chronic pain: Secondary | ICD-10-CM

## 2023-01-19 DIAGNOSIS — R293 Abnormal posture: Secondary | ICD-10-CM

## 2023-01-19 NOTE — Therapy (Signed)
OUTPATIENT PHYSICAL THERAPY UPPER EXTREMITY TREATMENT   Patient Name: Terry York MRN: [id number] DOB:1971-03-13, 52 y.o., male Today's Date: 01/19/2023  END OF SESSION:  PT End of Session - 01/19/23 1145     Visit Number 5    Date for PT Re-Evaluation 02/24/23    Authorization Type [employer]    PT Start Time 1103    PT Stop Time 1144    PT Time Calculation (min) 41 min    Activity Tolerance Patient tolerated treatment well    Behavior During Therapy WFL for tasks assessed/performed                 Past Medical History:  Diagnosis Date   Hypertension    Seizure (HCC) AGE 73   X 1 OR 2 NONE SINCE   Sleep apnea    Past Surgical History:  Procedure Laterality Date   COLONOSCOPY WITH PROPOFOL N/A 01/05/2015   Procedure: COLONOSCOPY WITH PROPOFOL;  Surgeon: Jeani Hawking, MD;  Location: [facility];  Service: Endoscopy;  Laterality: N/A;   COLONOSCOPY WITH PROPOFOL N/A 11/04/2018   Procedure: COLONOSCOPY WITH PROPOFOL;  Surgeon: Charna Elizabeth, MD;  Location: [facility];  Service: Endoscopy;  Laterality: N/A;   HEMORRHOID SURGERY N/A 12/03/2018   Procedure: HEMORRHOIDECTOMY (2ND COLUMN);  Surgeon: Romie Levee, MD;  Location: [facility];  Service: General;  Laterality: N/A;   Patient Active Problem List   Diagnosis Date Noted   Prediabetes 12/17/2022   Left shoulder pain 12/17/2022   Skipped heart beats 12/18/2021   Palpitations 12/18/2021   Essential hypertension 12/18/2021   Mixed hyperlipidemia 12/18/2021   OSA (obstructive sleep apnea) 01/23/2021   Anal fissure 05/03/2020   Colon cancer screening 05/03/2020   Family history of malignant neoplasm of gastrointestinal tract 05/03/2020   Anorectal disorder 05/03/2020   Hemorrhage of anus and rectum 05/03/2020   Rectal bleeding 05/03/2020   Internal hemorrhoids 05/03/2020   Obesity 05/03/2020    PCP: de [place], Raymond J, MD  REFERRING PROVIDER: de [place], Raymond J, MD  REFERRING DIAG: [phone]  (ICD-10-CM) - Left shoulder pain, unspecified chronicity  THERAPY DIAG:  Chronic left shoulder pain  Other muscle spasm  Cervicalgia  Abnormal posture  Rationale for Evaluation and Treatment: Rehabilitation  ONSET DATE: 09/2022  SUBJECTIVE:                                                                                                                                                                                      SUBJECTIVE STATEMENT: Patient reports he is doing well today. His neck feels how it felt before he started driving. He is not currently having pain.  Hand dominance:  Right  PERTINENT HISTORY: HTN, sleep apnea, Hx of seizures- none since age 46  PAIN: 01/19/2023 Are you having pain? Yes: NPRS scale: 0/10 Pain location: Lt upper trap area up into Lt side of neck; Lt posterior shoulder capsule Pain description: dull then as the day progresses it gets sharp Aggravating factors: Looking in blind spots (both directions); looking down Relieving factors: Massage gun; pain patch before work  PRECAUTIONS: None  RED FLAGS: None   WEIGHT BEARING RESTRICTIONS: No  FALLS:  Has patient fallen in last 6 months? 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 Holds steering wheel at 9 & 3.    UPPER EXTREMITY ROM: A/ROM & PROM WFL. With Lt shoulder flexion PROM noted soft tissue stretch end feel.  Cervical ROM : WFL; some discomfort with  cervical extension and bilateral cervical rotation  UPPER EXTREMITY MMT:  MMT Right eval Left eval  Shoulder flexion 5 5  Shoulder extension    Shoulder abduction 5 5  Shoulder adduction    Shoulder internal rotation Reached to inferior border of scapula Reached to inferior border of scapula  Shoulder external rotation Reached to spine of scapula Reached to spine of scapula  Middle trapezius 4 4  Lower trapezius 4 4  Elbow flexion 5 5  Elbow extension 5 5  Wrist flexion    Wrist extension    Wrist ulnar deviation    Wrist radial deviation    Wrist pronation    Wrist supination    Grip strength (lbs)    (Blank rows = not tested)   JOINT MOBILITY TESTING:  Good mobility for C2-C8; Decreased mobility of T1-T3  PALPATION:  Palpable taut bands and tenderness of Lt upper trap and levator scapulae    TODAY'S TREATMENT:                                                                                                                                         DATE:  01/15/2023 UBE 6 mins (3 forward/ 3 backwards)- PT present to discuss progress Cat Cow x 10 Thread the Needle x 8 each side Child's Pose x5 Shoulder Rows at cable column 2 x 10; 10 lbs Shoulder Extension at cable column 2 x 10; 10 lbs Lat Pull Downs 25 lbs 2 x 10  Seated I's Ys' T's 2 lb 2 x 10 each Seated chin tucks x 20  Seated thoracic extension on foam roll x 20 Wall Push ups 2 x 10 3 way scap stabilization blue loop x 8 each way  01/15/2023 UBE 6 mins (3 forward/ 3 backwards) Standing Shoulder ER 2 x 10 Red TB Shoulder Abduction 2 x 10 Red TB Shoulder Rows 2 x 10 Red TB Shoulder Extension 2 x 10 Red TB Trigger Point Dry-Needling  Treatment instructions: Expect mild to moderate muscle soreness. S/S of pneumothorax if dry needled over a lung field, and to seek immediate medical attention should they occur. Patient verbalized understanding of these instructions and education.  Patient Consent Given:  Yes Education handout provided: Previously provided Muscles treated: Bil cervical mutifidi, T1-4 multifidi, bil upper traps and Lt rhomboids Treatment response/outcome: Utilized skilled palpation to identify trigger points.  During dry needling able to palpate muscle twitch and muscle elongation  Elongation and release to bil neck and Lt UT  Skilled palpation and monitoring by PT during dry needling  01/08/2023 UBE 6 mins (3 forward/ 3 backwards) Y's at Wall 2 x 10 Seated Chin Tucks 2 x 8 Standing Shoulder ER 2 x 10 Red TB Shoulder Abduction 2 x 10 Red TB Shoulder Rows 2 x 10 Red TB Shoulder Extension 2 x 10 Red TB Standing D2 Flexion 2x10 each Bent Over I's Y's T's 2# DB 2 x 10 Seated Rows at matrix 25# 2x10 Manual: Grade III C8-T4     PATIENT EDUCATION: Education details: Shoulder mobility, DN handout Person educated: Patient Education method: Explanation, Demonstration, and Handouts Education comprehension: verbalized understanding, returned demonstration, and needs further education  HOME EXERCISE PROGRAM: Access Code: [email or web address] URL: [email or web address] Date: 01/15/2023 Prepared by: Tresa Endo  Exercises - Scapular Retraction with Resistance  - 2 x daily - 7 x weekly - 2 sets - 10 reps - Shoulder extension with resistance - Neutral  - 2 x daily - 7 x weekly - 2 sets - 10 reps - Shoulder External Rotation and Scapular Retraction with Resistance  - 2 x daily - 7 x weekly - 2 sets - 10 reps - Seated Shoulder Horizontal Abduction with Resistance - Thumbs Up  - 2 x daily - 7 x weekly - 2 sets - 10 reps  ASSESSMENT:  CLINICAL IMPRESSION: Patient continues to show progress with physical therapy. Patient verbalized he feels 65-70% better since starting therapy. Incorporated thoracic mobility exercises and patient required verbal and tactile cues for correct performance. Patient required moderate verbal cues for improved neck posture while performing exercises. With 3  way scap stabilization exercise patient verbalized the Lt side was harder than Rt due to muscle weakness. Patient will benefit from skilled PT to address the below impairments and improve overall function.    OBJECTIVE IMPAIRMENTS: decreased strength, increased muscle spasms, postural dysfunction, and pain.   ACTIVITY LIMITATIONS: sitting and reach over head  PARTICIPATION LIMITATIONS: driving, community activity, and occupation  PERSONAL FACTORS: 1 comorbidity: HTN  are also affecting patient's functional outcome.   REHAB POTENTIAL: Good  CLINICAL DECISION MAKING: Stable/uncomplicated  EVALUATION COMPLEXITY: Low  GOALS: Goals reviewed with patient? Yes  SHORT TERM GOALS: Target date: 01/27/2023  Patient will be independent with initial HEP. Baseline:  Goal status: MET  2.  Patient will demonstrate correct seated posture while driving. Baseline:  Goal status: MET   3.  Patient will be able to drive for > or = to 2.5 hours before onset of Lt shoulder and neck pain. Baseline: 3.5 hours  (01/15/23) Goal status: MET   LONG TERM  GOALS: Target date: 02/24/2023 Patient will demonstrate independence in advanced HEP. Baseline:  Goal status: INITIAL  2.  Patient will report < or = to 5/10 pain in Lt neck and shoulder at end of work day. Baseline: 8-9/10 Goal status: INITIAL   3.  Patient's FOTO score will improve to > or = to 79 for improved function. Baseline: 59 Goal status: INITIAL  4.  Patient will be able to drive for > or = to 3 hours before onset of Lt shoulder and neck pain. Baseline: 2 hours Goal status: INITIAL   PLAN: PT FREQUENCY: 2x/week  PT DURATION: 8 weeks  PLANNED INTERVENTIONS: Therapeutic exercises, Therapeutic activity, Neuromuscular re-education, Balance training, Gait training, Patient/Family education, Self Care, Joint mobilization, Joint manipulation, Stair training, Vestibular training, Canalith repositioning, Aquatic Therapy, Dry Needling,  Electrical stimulation, Spinal manipulation, Spinal mobilization, Cryotherapy, Moist heat, Compression bandaging, Taping, Traction, Ultrasound, Ionotophoresis 4mg /ml Dexamethasone, and Manual therapy  PLAN FOR NEXT SESSION: DN next session; continue postural strengthening and thoracic mobility   Claude Manges, PT 01/19/23 11:45 AM   [facility] [address] [place], [place] [address] Phone # [phone] Fax [phone]

## 2023-01-22 ENCOUNTER — Ambulatory Visit: Payer: 59

## 2023-01-22 DIAGNOSIS — M25512 Pain in left shoulder: Secondary | ICD-10-CM | POA: Diagnosis not present

## 2023-01-22 DIAGNOSIS — R293 Abnormal posture: Secondary | ICD-10-CM

## 2023-01-22 DIAGNOSIS — M62838 Other muscle spasm: Secondary | ICD-10-CM

## 2023-01-22 DIAGNOSIS — G8929 Other chronic pain: Secondary | ICD-10-CM

## 2023-01-22 DIAGNOSIS — M542 Cervicalgia: Secondary | ICD-10-CM

## 2023-01-22 NOTE — Therapy (Signed)
OUTPATIENT PHYSICAL THERAPY UPPER EXTREMITY TREATMENT   Patient Name: Terry York MRN: [id number] DOB:October 13, 1970, 52 y.o., male Today's Date: 01/22/2023  END OF SESSION:  PT End of Session - 01/22/23 1149     Visit Number 6    Date for PT Re-Evaluation 02/24/23    Authorization Type [employer]    PT Start Time 1109    PT Stop Time 1150    PT Time Calculation (min) 41 min    Activity Tolerance Patient tolerated treatment well    Behavior During Therapy WFL for tasks assessed/performed                  Past Medical History:  Diagnosis Date   Hypertension    Seizure (HCC) AGE 9   X 1 OR 2 NONE SINCE   Sleep apnea    Past Surgical History:  Procedure Laterality Date   COLONOSCOPY WITH PROPOFOL N/A 01/05/2015   Procedure: COLONOSCOPY WITH PROPOFOL;  Surgeon: Jeani Hawking, MD;  Location: [facility];  Service: Endoscopy;  Laterality: N/A;   COLONOSCOPY WITH PROPOFOL N/A 11/04/2018   Procedure: COLONOSCOPY WITH PROPOFOL;  Surgeon: Charna Elizabeth, MD;  Location: [facility];  Service: Endoscopy;  Laterality: N/A;   HEMORRHOID SURGERY N/A 12/03/2018   Procedure: HEMORRHOIDECTOMY (2ND COLUMN);  Surgeon: Romie Levee, MD;  Location: [facility];  Service: General;  Laterality: N/A;   Patient Active Problem List   Diagnosis Date Noted   Prediabetes 12/17/2022   Left shoulder pain 12/17/2022   Skipped heart beats 12/18/2021   Palpitations 12/18/2021   Essential hypertension 12/18/2021   Mixed hyperlipidemia 12/18/2021   OSA (obstructive sleep apnea) 01/23/2021   Anal fissure 05/03/2020   Colon cancer screening 05/03/2020   Family history of malignant neoplasm of gastrointestinal tract 05/03/2020   Anorectal disorder 05/03/2020   Hemorrhage of anus and rectum 05/03/2020   Rectal bleeding 05/03/2020   Internal hemorrhoids 05/03/2020   Obesity 05/03/2020    PCP: de [place], Raymond J, MD  REFERRING PROVIDER: de [place], Raymond J, MD  REFERRING DIAG: [phone]  (ICD-10-CM) - Left shoulder pain, unspecified chronicity  THERAPY DIAG:  Chronic left shoulder pain  Other muscle spasm  Cervicalgia  Abnormal posture  Rationale for Evaluation and Treatment: Rehabilitation  ONSET DATE: 09/2022  SUBJECTIVE:                                                                                                                                                                                      SUBJECTIVE STATEMENT: Patient reports he is doing well today. His neck feels how it felt before he started driving. He is not currently having pain.  Hand  dominance: Right  PERTINENT HISTORY: HTN, sleep apnea, Hx of seizures- none since age 58  PAIN: 01/19/2023 Are you having pain? Yes: NPRS scale: 0/10 Pain location: Lt upper trap area up into Lt side of neck; Lt posterior shoulder capsule Pain description: dull then as the day progresses it gets sharp Aggravating factors: Looking in blind spots (both directions); looking down Relieving factors: Massage gun; pain patch before work  PRECAUTIONS: None  RED FLAGS: None   WEIGHT BEARING RESTRICTIONS: No  FALLS:  Has patient fallen in last 6 months? 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 Holds steering wheel at 9 & 3.    UPPER EXTREMITY ROM: A/ROM & PROM WFL. With Lt shoulder flexion PROM noted soft tissue stretch end feel.  Cervical ROM : WFL; some discomfort with  cervical extension and bilateral cervical rotation  UPPER EXTREMITY MMT:  MMT Right eval Left eval  Shoulder flexion 5 5  Shoulder extension    Shoulder abduction 5 5  Shoulder adduction    Shoulder internal rotation Reached to inferior border of scapula Reached to inferior border of scapula  Shoulder external rotation Reached to spine of scapula Reached to spine of scapula  Middle trapezius 4 4  Lower trapezius 4 4  Elbow flexion 5 5  Elbow extension 5 5  Wrist flexion    Wrist extension    Wrist ulnar deviation    Wrist radial deviation    Wrist pronation    Wrist supination    Grip strength (lbs)    (Blank rows = not tested)   JOINT MOBILITY TESTING:  Good mobility for C2-C8; Decreased mobility of T1-T3  PALPATION:  Palpable taut bands and tenderness of Lt upper trap and levator scapulae    TODAY'S TREATMENT:                                                                                                                                         DATE:  01/20/2023 UBE 6 mins (3 forward/ 3 backwards) Level 2- PT present to discuss progress Seated ball rolls: forward and lateral 5" x5 each  Shoulder Rows at cable column 2 x 10; 10 lbs Shoulder Extension at cable column 2 x 10; 10 lbs Seated I's Ys' T's 2 lb 2 x 10 each- added 1# weights   Trigger Point Dry-Needling  Treatment instructions: Expect mild to moderate muscle  soreness. S/S of pneumothorax if dry needled over a lung field, and to seek immediate medical attention should they occur. Patient verbalized understanding of these instructions and education.  Patient Consent Given: Yes Education handout provided: Previously provided Muscles treated: Bil cervical mutifidi, T1-4 multifidi, bil upper traps and Lt rhomboids Treatment response/outcome: Utilized skilled palpation to identify trigger points.  During dry needling able to palpate muscle twitch and muscle elongation  Elongation and release to bil neck and Lt UT   Skilled palpation and monitoring by PT during dry needling  01/20/2023 UBE 6 mins (3 forward/ 3 backwards)- PT present to discuss progress Cat Cow x 10 Thread the Needle x 8 each side Child's Pose x5 Shoulder Rows at cable column 2 x 10; 10 lbs Shoulder Extension at cable column 2 x 10; 10 lbs Lat Pull Downs 25 lbs 2 x 10  Seated I's Ys' T's 2 lb 2 x 10 each Seated chin tucks x 20  Seated thoracic extension on foam roll x 20 Wall Push ups 2 x 10 3 way scap stabilization blue loop x 8 each way  01/15/2023 UBE 6 mins (3 forward/ 3 backwards) Standing Shoulder ER 2 x 10 Red TB Shoulder Abduction 2 x 10 Red TB Shoulder Rows 2 x 10 Red TB Shoulder Extension 2 x 10 Red TB Trigger Point Dry-Needling  Treatment instructions: Expect mild to moderate muscle soreness. S/S of pneumothorax if dry needled over a lung field, and to seek immediate medical attention should they occur. Patient verbalized understanding of these instructions and education.  Patient Consent Given: Yes Education handout provided: Previously provided Muscles treated: Bil cervical mutifidi, T1-4 multifidi, bil upper traps and Lt rhomboids Treatment response/outcome: Utilized skilled palpation to identify trigger points.  During dry needling able to palpate muscle twitch and muscle elongation  Elongation and release to bil neck and Lt UT  Skilled palpation and monitoring by PT during dry needling     PATIENT EDUCATION: Education details: Shoulder mobility, DN handout Person educated: Patient Education method: Explanation, Demonstration, and Handouts Education comprehension: verbalized understanding, returned demonstration, and needs further education  HOME EXERCISE PROGRAM: Access Code: [email or web address] URL: [email or web address] Date: 01/15/2023 Prepared by: Tresa Endo  Exercises - Scapular Retraction with Resistance  - 2 x daily - 7 x weekly - 2 sets - 10 reps - Shoulder extension with resistance - Neutral   - 2 x daily - 7 x weekly - 2 sets - 10 reps - Shoulder External Rotation and Scapular Retraction with Resistance  - 2 x daily - 7 x weekly - 2 sets - 10 reps - Seated Shoulder Horizontal Abduction with Resistance - Thumbs Up  - 2 x daily - 7 x weekly - 2 sets - 10 reps  ASSESSMENT:  CLINICAL IMPRESSION:  Patient continues to report that he feels 65-70% better since starting therapy. Incorporated thoracic mobility exercises and patient required verbal and tactile cues for correct performance. Patient required moderate verbal cues for improved neck posture while performing exercises. He did well with addition of 1# weights with 3 way raises.  Pt had good response to DN with twitch response and improved tissue mobility in all areas treated. Fewer trigger points overall in Lt upper traps and rhomboids.  Patient will benefit from skilled PT to address the below impairments and improve overall function.    OBJECTIVE IMPAIRMENTS: decreased strength, increased muscle spasms, postural dysfunction, and pain.   ACTIVITY LIMITATIONS: sitting and reach over head  PARTICIPATION LIMITATIONS: driving, community activity, and  occupation  PERSONAL FACTORS: 1 comorbidity: HTN  are also affecting patient's functional outcome.   REHAB POTENTIAL: Good  CLINICAL DECISION MAKING: Stable/uncomplicated  EVALUATION COMPLEXITY: Low  GOALS: Goals reviewed with patient? Yes  SHORT TERM GOALS: Target date: 01/27/2023  Patient will be independent with initial HEP. Baseline:  Goal status: MET  2.  Patient will demonstrate correct seated posture while driving. Baseline:  Goal status: MET   3.  Patient will be able to drive for > or = to 2.5 hours before onset of Lt shoulder and neck pain. Baseline: 3.5 hours  (01/15/23) Goal status: MET   LONG TERM GOALS: Target date: 02/24/2023 Patient will demonstrate independence in advanced HEP. Baseline:  Goal status: INITIAL  2.  Patient will report < or = to  5/10 pain in Lt neck and shoulder at end of work day. Baseline: 8-9/10 Goal status: INITIAL   3.  Patient's FOTO score will improve to > or = to 79 for improved function. Baseline: 59 Goal status: INITIAL  4.  Patient will be able to drive for > or = to 3 hours before onset of Lt shoulder and neck pain. Baseline: 2 hours Goal status: INITIAL   PLAN: PT FREQUENCY: 2x/week  PT DURATION: 8 weeks  PLANNED INTERVENTIONS: Therapeutic exercises, Therapeutic activity, Neuromuscular re-education, Balance training, Gait training, Patient/Family education, Self Care, Joint mobilization, Joint manipulation, Stair training, Vestibular training, Canalith repositioning, Aquatic Therapy, Dry Needling, Electrical stimulation, Spinal manipulation, Spinal mobilization, Cryotherapy, Moist heat, Compression bandaging, Taping, Traction, Ultrasound, Ionotophoresis 4mg /ml Dexamethasone, and Manual therapy  PLAN FOR NEXT SESSION: DN next session; continue postural strengthening and thoracic mobility   Lorrene Reid, PT 01/22/23 11:55 AM    [facility] [address] [place], [place] [address] Phone # [phone] Fax [phone]

## 2023-01-26 ENCOUNTER — Ambulatory Visit: Payer: 59 | Admitting: Physical Therapy

## 2023-02-02 ENCOUNTER — Encounter: Payer: Self-pay | Admitting: Physical Therapy

## 2023-02-02 ENCOUNTER — Ambulatory Visit: Payer: 59 | Admitting: Physical Therapy

## 2023-02-02 DIAGNOSIS — M542 Cervicalgia: Secondary | ICD-10-CM

## 2023-02-02 DIAGNOSIS — G8929 Other chronic pain: Secondary | ICD-10-CM

## 2023-02-02 DIAGNOSIS — M62838 Other muscle spasm: Secondary | ICD-10-CM

## 2023-02-02 DIAGNOSIS — R293 Abnormal posture: Secondary | ICD-10-CM

## 2023-02-02 DIAGNOSIS — M25512 Pain in left shoulder: Secondary | ICD-10-CM | POA: Diagnosis not present

## 2023-02-02 NOTE — Therapy (Signed)
OUTPATIENT PHYSICAL THERAPY UPPER EXTREMITY TREATMENT   Patient Name: Terry York MRN: [id number] DOB:Apr 27, 1970, 52 y.o., male Today's Date: 02/02/2023  END OF SESSION:  PT End of Session - 02/02/23 1230     Visit Number 7    Date for PT Re-Evaluation 02/24/23    Authorization Type [employer]    PT Start Time 1150    PT Stop Time 1230    PT Time Calculation (min) 40 min    Activity Tolerance Patient tolerated treatment well    Behavior During Therapy WFL for tasks assessed/performed                   Past Medical History:  Diagnosis Date   Hypertension    Seizure (HCC) AGE 53   X 1 OR 2 NONE SINCE   Sleep apnea    Past Surgical History:  Procedure Laterality Date   COLONOSCOPY WITH PROPOFOL N/A 01/05/2015   Procedure: COLONOSCOPY WITH PROPOFOL;  Surgeon: Jeani Hawking, MD;  Location: [facility];  Service: Endoscopy;  Laterality: N/A;   COLONOSCOPY WITH PROPOFOL N/A 11/04/2018   Procedure: COLONOSCOPY WITH PROPOFOL;  Surgeon: Charna Elizabeth, MD;  Location: [facility];  Service: Endoscopy;  Laterality: N/A;   HEMORRHOID SURGERY N/A 12/03/2018   Procedure: HEMORRHOIDECTOMY (2ND COLUMN);  Surgeon: Romie Levee, MD;  Location: [facility];  Service: General;  Laterality: N/A;   Patient Active Problem List   Diagnosis Date Noted   Prediabetes 12/17/2022   Left shoulder pain 12/17/2022   Skipped heart beats 12/18/2021   Palpitations 12/18/2021   Essential hypertension 12/18/2021   Mixed hyperlipidemia 12/18/2021   OSA (obstructive sleep apnea) 01/23/2021   Anal fissure 05/03/2020   Colon cancer screening 05/03/2020   Family history of malignant neoplasm of gastrointestinal tract 05/03/2020   Anorectal disorder 05/03/2020   Hemorrhage of anus and rectum 05/03/2020   Rectal bleeding 05/03/2020   Internal hemorrhoids 05/03/2020   Obesity 05/03/2020    PCP: de [place], Raymond J, MD  REFERRING PROVIDER: de [place], Raymond J, MD  REFERRING DIAG: [phone]  (ICD-10-CM) - Left shoulder pain, unspecified chronicity  THERAPY DIAG:  Chronic left shoulder pain  Other muscle spasm  Cervicalgia  Abnormal posture  Rationale for Evaluation and Treatment: Rehabilitation  ONSET DATE: 09/2022  SUBJECTIVE:                                                                                                                                                                                      SUBJECTIVE STATEMENT: Patient reports he is going good today. He is not currently having any pain. Since starting therapy he reports feeling 95% better.  Hand  dominance: Right  PERTINENT HISTORY: HTN, sleep apnea, Hx of seizures- none since age 12  PAIN: 01/19/2023 Are you having pain? Yes: NPRS scale: 0/10 Pain location: Lt upper trap area up into Lt side of neck; Lt posterior shoulder capsule Pain description: dull then as the day progresses it gets sharp Aggravating factors: Looking in blind spots (both directions); looking down Relieving factors: Massage gun; pain patch before work  PRECAUTIONS: None  RED FLAGS: None   WEIGHT BEARING RESTRICTIONS: No  FALLS:  Has patient fallen in last 6 months? 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 Holds steering wheel at 9 & 3.    UPPER EXTREMITY ROM: A/ROM & PROM WFL. With Lt shoulder flexion PROM noted soft tissue stretch end feel.  Cervical ROM : WFL; some discomfort with  cervical extension and bilateral cervical rotation  UPPER EXTREMITY MMT:  MMT Right eval Left eval  Shoulder flexion 5 5  Shoulder extension    Shoulder abduction 5 5  Shoulder adduction    Shoulder internal rotation Reached to inferior border of scapula Reached to inferior border of scapula  Shoulder external rotation Reached to spine of scapula Reached to spine of scapula  Middle trapezius 4 4  Lower trapezius 4 4  Elbow flexion 5 5  Elbow extension 5 5  Wrist flexion    Wrist extension    Wrist ulnar deviation    Wrist radial deviation    Wrist pronation    Wrist supination    Grip strength (lbs)    (Blank rows = not tested)   JOINT MOBILITY TESTING:  Good mobility for C2-C8; Decreased mobility of T1-T3  PALPATION:  Palpable taut bands and tenderness of Lt upper trap and levator scapulae    TODAY'S TREATMENT:                                                                                                                                         DATE:  02/02/2023 UBE 6 mins (3 forward/ 3 backwards) Level 2- PT present to discuss progress Seated ball rolls: forward and lateral 5" x5 each  Shoulder Rows at cable column 2 x 10; 10 lbs Shoulder Extension at cable column 2 x 10; 10 lbs Wall Push Ups 2 x 8 each 3 way scap stabilization blue loop x 8 Seated I's Ys' T's 1# 2 x 10 each Standing scaption  1# 2 x 10 Standing D2 flexion Green TB  2 x 10 each way Lat Pull Downs 25 lbs 2 x 10    01/20/2023 UBE 6 mins (3 forward/ 3 backwards) Level 2- PT present to discuss progress Seated ball rolls: forward and lateral 5" x5 each  Shoulder Rows at cable column 2 x 10; 10 lbs Shoulder Extension at cable column 2 x 10; 10 lbs Seated I's Ys' T's 2 lb 2 x 10 each- added 1# weights   Trigger Point Dry-Needling  Treatment instructions: Expect mild to moderate muscle soreness. S/S of pneumothorax if dry needled over a lung field, and to seek immediate medical attention should they  occur. Patient verbalized understanding of these instructions and education.  Patient Consent Given: Yes Education handout provided: Previously provided Muscles treated: Bil cervical mutifidi, T1-4 multifidi, bil upper traps and Lt rhomboids Treatment response/outcome: Utilized skilled palpation to identify trigger points.  During dry needling able to palpate muscle twitch and muscle elongation  Elongation and release to bil neck and Lt UT  Skilled palpation and monitoring by PT during dry needling  01/20/2023 UBE 6 mins (3 forward/ 3 backwards)- PT present to discuss progress Cat Cow x 10 Thread the Needle x 8 each side Child's Pose x5 Shoulder Rows at cable column 2 x 10; 10 lbs Shoulder Extension at cable column 2 x 10; 10 lbs Lat Pull Downs 25 lbs 2 x 10  Seated I's Ys' T's 2 lb 2 x 10 each Seated chin tucks x 20  Seated thoracic extension on foam roll x 20 Wall Push ups 2 x 10 3 way scap stabilization blue loop x 8 each way   PATIENT EDUCATION: Education details: Shoulder mobility, DN handout Person educated: Patient Education method: Explanation, Demonstration, and Handouts Education comprehension: verbalized understanding, returned demonstration, and needs further education  HOME EXERCISE PROGRAM: Access Code: [email or web address] URL: [email or web address] Date: 01/15/2023 Prepared by: Tresa Endo  Exercises - Scapular Retraction with Resistance  - 2 x daily - 7 x weekly - 2 sets - 10 reps - Shoulder extension with resistance - Neutral  - 2 x daily - 7 x weekly - 2 sets - 10 reps - Shoulder External Rotation and Scapular Retraction with Resistance  - 2 x daily - 7 x weekly - 2 sets - 10 reps - Seated Shoulder Horizontal Abduction with Resistance - Thumbs Up  - 2 x daily - 7 x weekly - 2 sets - 10 reps  ASSESSMENT:  CLINICAL IMPRESSION: Patient reports he feels 95% better since beginning therapy. He is able complete cervical motions without increased pain and without  feeling a pulling sensation. Patient feels he is nearing the end of plan of care and feels capable of continuing HEP. Today's treatment session focused on postural strengthening and thoracic mobility. Patient required minimal verbal cues for form correction of exercises. Patient tolerated treatment session well and did not experience any increased pain. Patient will benefit from skilled PT to address the below impairments and improve overall function.    OBJECTIVE IMPAIRMENTS: decreased strength, increased muscle spasms, postural dysfunction, and pain.   ACTIVITY LIMITATIONS: sitting and reach over head  PARTICIPATION LIMITATIONS: driving, community activity, and occupation  PERSONAL FACTORS: 1 comorbidity: HTN  are also affecting patient's functional outcome.   REHAB POTENTIAL: Good  CLINICAL DECISION MAKING: Stable/uncomplicated  EVALUATION COMPLEXITY: Low  GOALS: Goals reviewed with patient? Yes  SHORT TERM GOALS: Target date: 01/27/2023  Patient will be independent with initial HEP. Baseline:  Goal  status: MET  2.  Patient will demonstrate correct seated posture while driving. Baseline:  Goal status: MET   3.  Patient will be able to drive for > or = to 2.5 hours before onset of Lt shoulder and neck pain. Baseline: 3.5 hours  (01/15/23) Goal status: MET   LONG TERM GOALS: Target date: 02/24/2023 Patient will demonstrate independence in advanced HEP. Baseline:  Goal status: MET  2.  Patient will report < or = to 5/10 pain in Lt neck and shoulder at end of work day. Baseline: 8-9/10 Goal status: INITIAL   3.  Patient's FOTO score will improve to > or = to 79 for improved function. Baseline: 59 Goal status: INITIAL  4.  Patient will be able to drive for > or = to 3 hours before onset of Lt shoulder and neck pain. Baseline: 2 hours Goal status: INITIAL   PLAN: PT FREQUENCY: 2x/week  PT DURATION: 8 weeks  PLANNED INTERVENTIONS: Therapeutic exercises,  Therapeutic activity, Neuromuscular re-education, Balance training, Gait training, Patient/Family education, Self Care, Joint mobilization, Joint manipulation, Stair training, Vestibular training, Canalith repositioning, Aquatic Therapy, Dry Needling, Electrical stimulation, Spinal manipulation, Spinal mobilization, Cryotherapy, Moist heat, Compression bandaging, Taping, Traction, Ultrasound, Ionotophoresis 4mg /ml Dexamethasone, and Manual therapy  PLAN FOR NEXT SESSION: discharge next treatment session; Peyton Najjar, PT 02/02/23 12:31 PM [facility] [address], [address] [place], [place] [address] Phone # [phone] Fax [phone]

## 2023-02-05 ENCOUNTER — Encounter: Payer: Self-pay | Admitting: Physical Therapy

## 2023-02-05 ENCOUNTER — Ambulatory Visit: Payer: 59 | Admitting: Physical Therapy

## 2023-02-05 DIAGNOSIS — M25512 Pain in left shoulder: Secondary | ICD-10-CM | POA: Diagnosis not present

## 2023-02-05 DIAGNOSIS — M542 Cervicalgia: Secondary | ICD-10-CM

## 2023-02-05 DIAGNOSIS — G8929 Other chronic pain: Secondary | ICD-10-CM

## 2023-02-05 DIAGNOSIS — R293 Abnormal posture: Secondary | ICD-10-CM

## 2023-02-05 DIAGNOSIS — M62838 Other muscle spasm: Secondary | ICD-10-CM

## 2023-02-05 NOTE — Therapy (Signed)
OUTPATIENT PHYSICAL THERAPY UPPER EXTREMITY TREATMENT/ DISCHARGE NOTE   Patient Name: Terry York MRN: [id number] DOB:1971-04-05, 52 y.o., male Today's Date: 02/05/2023  END OF SESSION:  PT End of Session - 02/05/23 1226     Visit Number 8    Date for PT Re-Evaluation 02/24/23    Authorization Type [employer]    PT Start Time 1140    PT Stop Time 1220    PT Time Calculation (min) 40 min    Activity Tolerance Patient tolerated treatment well    Behavior During Therapy WFL for tasks assessed/performed                    Past Medical History:  Diagnosis Date   Hypertension    Seizure (HCC) AGE 67   X 1 OR 2 NONE SINCE   Sleep apnea    Past Surgical History:  Procedure Laterality Date   COLONOSCOPY WITH PROPOFOL N/A 01/05/2015   Procedure: COLONOSCOPY WITH PROPOFOL;  Surgeon: Jeani Hawking, MD;  Location: [facility];  Service: Endoscopy;  Laterality: N/A;   COLONOSCOPY WITH PROPOFOL N/A 11/04/2018   Procedure: COLONOSCOPY WITH PROPOFOL;  Surgeon: Charna Elizabeth, MD;  Location: [facility];  Service: Endoscopy;  Laterality: N/A;   HEMORRHOID SURGERY N/A 12/03/2018   Procedure: HEMORRHOIDECTOMY (2ND COLUMN);  Surgeon: Romie Levee, MD;  Location: [facility];  Service: General;  Laterality: N/A;   Patient Active Problem List   Diagnosis Date Noted   Prediabetes 12/17/2022   Left shoulder pain 12/17/2022   Skipped heart beats 12/18/2021   Palpitations 12/18/2021   Essential hypertension 12/18/2021   Mixed hyperlipidemia 12/18/2021   OSA (obstructive sleep apnea) 01/23/2021   Anal fissure 05/03/2020   Colon cancer screening 05/03/2020   Family history of malignant neoplasm of gastrointestinal tract 05/03/2020   Anorectal disorder 05/03/2020   Hemorrhage of anus and rectum 05/03/2020   Rectal bleeding 05/03/2020   Internal hemorrhoids 05/03/2020   Obesity 05/03/2020    PCP: de [place], Raymond J, MD  REFERRING PROVIDER: de [place], Raymond J,  MD  REFERRING DIAG: [phone] (ICD-10-CM) - Left shoulder pain, unspecified chronicity  THERAPY DIAG:  Chronic left shoulder pain  Other muscle spasm  Cervicalgia  Abnormal posture  Rationale for Evaluation and Treatment: Rehabilitation  ONSET DATE: 09/2022  SUBJECTIVE:                                                                                                                                                                                      SUBJECTIVE STATEMENT: Patient reports he is doing good today. He feels a little discomfort in his shoulder but it is not unbearable.  Hand dominance: Right  PERTINENT HISTORY: HTN, sleep apnea, Hx of seizures- none since age 61  PAIN: 01/19/2023 Are you having pain? Yes: NPRS scale: 0/10 Pain location: Lt upper trap area up into Lt side of neck; Lt posterior shoulder capsule Pain description: dull then as the day progresses it gets sharp Aggravating factors: Looking in blind spots (both directions); looking down Relieving factors: Massage gun; pain patch before work  PRECAUTIONS: None  RED FLAGS: None   WEIGHT BEARING RESTRICTIONS: No  FALLS:  Has patient fallen in last 6 months? 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 Holds steering wheel at 9 & 3.    UPPER EXTREMITY ROM: A/ROM & PROM WFL. With Lt shoulder flexion PROM noted soft tissue stretch end  feel.  Cervical ROM : WFL; some discomfort with cervical extension and bilateral cervical rotation  UPPER EXTREMITY MMT:  MMT Right eval Left eval  Shoulder flexion 5 5  Shoulder extension    Shoulder abduction 5 5  Shoulder adduction    Shoulder internal rotation Reached to inferior border of scapula Reached to inferior border of scapula  Shoulder external rotation Reached to spine of scapula Reached to spine of scapula  Middle trapezius 4 4  Lower trapezius 4 4  Elbow flexion 5 5  Elbow extension 5 5  Wrist flexion    Wrist extension    Wrist ulnar deviation    Wrist radial deviation    Wrist pronation    Wrist supination    Grip strength (lbs)    (Blank rows = not tested)   JOINT MOBILITY TESTING:  Good mobility for C2-C8; Decreased mobility of T1-T3  PALPATION:  Palpable taut bands and tenderness of Lt upper trap and levator scapulae    TODAY'S TREATMENT:                                                                                                                                         DATE:  02/05/2023 UBE 6 mins (3 forward/ 3 backwards) Level 2- PT present to discuss progress Seated ball rolls: forward and lateral 5" x5 each  Shoulder Rows at cable column 2 x 10; 10 lbs Shoulder Extension at cable column 2 x 10; 10 lbs Seated matrix rows 25# 2 x 10 Lat Pull Downs 25 lbs 2 x 10  3 way scap stabilization blue  loop x 8 FOTO: 86 - goal met Standing scaption 1# 2 x 10 Standing shoulder Flexion 1# 2 x 10 Bottoms Up KB press 5lb x 10  Wall Push Ups 2 x 8   02/02/2023 UBE 6 mins (3 forward/ 3 backwards) Level 2- PT present to discuss progress Seated ball rolls: forward and lateral 5" x5 each  Shoulder Rows at cable column 2 x 10; 10 lbs Shoulder Extension at cable column 2 x 10; 10 lbs Wall Push Ups 2 x 8 each 3 way scap stabilization blue loop x 8 Seated I's Ys' T's 1# 2 x 10 each Standing scaption 1# 2 x 10 Standing D2 flexion Green TB  2 x 10 each  way Lat Pull Downs 25 lbs 2 x 10    01/20/2023 UBE 6 mins (3 forward/ 3 backwards) Level 2- PT present to discuss progress Seated ball rolls: forward and lateral 5" x5 each  Shoulder Rows at cable column 2 x 10; 10 lbs Shoulder Extension at cable column 2 x 10; 10 lbs Seated I's Ys' T's 2 lb 2 x 10 each- added 1# weights   Trigger Point Dry-Needling  Treatment instructions: Expect mild to moderate muscle soreness. S/S of pneumothorax if dry needled over a lung field, and to seek immediate medical attention should they occur. Patient verbalized understanding of these instructions and education.  Patient Consent Given: Yes Education handout provided: Previously provided Muscles treated: Bil cervical mutifidi, T1-4 multifidi, bil upper traps and Lt rhomboids Treatment response/outcome: Utilized skilled palpation to identify trigger points.  During dry needling able to palpate muscle twitch and muscle elongation  Elongation and release to bil neck and Lt UT  Skilled palpation and monitoring by PT during dry needling    PATIENT EDUCATION: Education details: Shoulder mobility, DN handout Person educated: Patient Education method: Explanation, Demonstration, and Handouts Education comprehension: verbalized understanding, returned demonstration, and needs further education  HOME EXERCISE PROGRAM: Access Code: [email or web address] URL: [email or web address] Date: 02/05/2023 Prepared by: Claude Manges  Exercises - Scapular Retraction with Resistance  - 2 x daily - 7 x weekly - 2 sets - 10 reps - Shoulder extension with resistance - Neutral  - 2 x daily - 7 x weekly - 2 sets - 10 reps - Shoulder External Rotation and Scapular Retraction with Resistance  - 2 x daily - 7 x weekly - 2 sets - 10 reps - Seated Shoulder Horizontal Abduction with Resistance - Thumbs Up  - 2 x daily - 7 x weekly - 2 sets - 10 reps - Cat Cow  - 2 x daily - 7 x weekly - 2 sets - 10 reps - Quadruped Thoracic  Rotation - Reach Under  - 2 x daily - 7 x weekly - 2 sets - 10 reps  ASSESSMENT:  CLINICAL IMPRESSION: Terry York had made great improvements with physical therapy. He has not had any neck or shoulder pain that has interfered with his functional activities. He has met all functional goals at this time and demonstrates motivation to continue HEP at home. Updated patient's home program to include thoracic mobility exercises. Provided theraband with increased resistance for HEP exercises. Patient to discharge home with HEP.    OBJECTIVE IMPAIRMENTS: decreased strength, increased muscle spasms, postural dysfunction, and pain.   ACTIVITY LIMITATIONS: sitting and reach over head  PARTICIPATION LIMITATIONS: driving, community activity, and occupation  PERSONAL FACTORS: 1 comorbidity: HTN  are also affecting patient's functional outcome.   REHAB POTENTIAL: Good  CLINICAL DECISION MAKING: Stable/uncomplicated  EVALUATION COMPLEXITY: Low  GOALS: Goals reviewed with patient? Yes  SHORT TERM GOALS: Target date: 01/27/2023  Patient will be independent with initial HEP. Baseline:  Goal status: MET  2.  Patient will demonstrate correct seated posture while driving. Baseline:  Goal status: MET   3.  Patient will be able to drive for > or = to 2.5 hours before onset of Lt shoulder and neck pain. Baseline: 3.5 hours  (01/15/23) Goal status: MET    LONG TERM GOALS: Target date: 02/24/2023 Patient will demonstrate independence in advanced HEP. Baseline:  Goal status: MET  2.  Patient will report < or = to 5/10 pain in Lt neck and shoulder at end of work day. Baseline: 8-9/10 Goal status: MET 02/05/2023   3.  Patient's FOTO score will improve to > or = to 79 for improved function. Baseline: 59 Goal status: MET 86 02/05/2023  4.  Patient will be able to drive for > or = to 3 hours before onset of Lt shoulder and neck pain. Baseline: 2 hours Goal status: MET 02/05/2023   PLAN: PT  FREQUENCY: 2x/week  PT DURATION: 8 weeks  PLANNED INTERVENTIONS: Therapeutic exercises, Therapeutic activity, Neuromuscular re-education, Balance training, Gait training, Patient/Family education, Self Care, Joint mobilization, Joint manipulation, Stair training, Vestibular training, Canalith repositioning, Aquatic Therapy, Dry Needling, Electrical stimulation, Spinal manipulation, Spinal mobilization, Cryotherapy, Moist heat, Compression bandaging, Taping, Traction, Ultrasound, Ionotophoresis 4mg /ml Dexamethasone, and Manual therapy  PLAN FOR NEXT SESSION: Patient to d/c home with HEP   PHYSICAL THERAPY DISCHARGE SUMMARY  Visits from Start of Care: 8  Current functional level related to goals / functional outcomes: [place] had made great improvements with physical therapy and met all his goals. He has not had any neck or shoulder pain that has interfered with his functional activities.   Remaining deficits: None   Education / Equipment: See updated HEP above   Patient agrees to discharge. Patient goals were met. Patient is being discharged due to meeting the stated rehab goals.   Claude Manges, PT 02/05/23 12:27 PM [facility] [address], [address] [place], [place] [address] Phone # [phone] Fax [phone]

## 2023-03-18 ENCOUNTER — Ambulatory Visit (HOSPITAL_BASED_OUTPATIENT_CLINIC_OR_DEPARTMENT_OTHER): Payer: 59 | Admitting: Family Medicine

## 2023-04-21 ENCOUNTER — Encounter (HOSPITAL_BASED_OUTPATIENT_CLINIC_OR_DEPARTMENT_OTHER): Payer: Self-pay | Admitting: Family Medicine

## 2023-04-21 ENCOUNTER — Ambulatory Visit (HOSPITAL_BASED_OUTPATIENT_CLINIC_OR_DEPARTMENT_OTHER): Payer: 59 | Admitting: Family Medicine

## 2023-04-21 VITALS — BP 126/85 | HR 72 | Ht 70.0 in | Wt 231.8 lb

## 2023-04-21 DIAGNOSIS — M25512 Pain in left shoulder: Secondary | ICD-10-CM | POA: Diagnosis not present

## 2023-04-21 DIAGNOSIS — Z0000 Encounter for general adult medical examination without abnormal findings: Secondary | ICD-10-CM

## 2023-04-21 NOTE — Assessment & Plan Note (Signed)
 Patient reports he is doing well and symptoms are much improved from last office visit.  He did work with physical therapy which he found to be very helpful in regards to treatment of symptoms.  Last visit with them was a couple months ago.  He has been working on home exercise program as recommended by physical therapy.  Does not have any specific concerns related to symptoms at this time. Can plan to follow-up as needed.  Consider referral back to PT should symptoms return in the future.

## 2023-04-21 NOTE — Patient Instructions (Signed)
  Medication Instructions:  Your physician recommends that you continue on your current medications as directed. Please refer to the Current Medication list given to you today. --If you need a refill on any your medications before your next appointment, please call your pharmacy first. If no refills are authorized on file call the office.-- Lab Work: Your physician has recommended that you have lab work today: one week before If you have labs (blood work) drawn today and your tests are completely normal, you will receive your results via MyChart message OR a phone call from our staff.  Please ensure you check your voicemail in the event that you authorized detailed messages to be left on a delegated number. If you have any lab test that is abnormal or we need to change your treatment, we will call you to review the results.   Follow-Up: Your next appointment:   Your physician recommends that you schedule a follow-up appointment in: 2-3 month for Physical  with Dr. de [place] - You will receive a text message or e-mail with a link to a survey about your care and experience with us  today! We would greatly appreciate your feedback!   Thanks for letting us  be apart of your health journey!!  Primary Care and Sports Medicine   Dr. Quintin sheerer [place]   We encourage you to activate your patient portal called MyChart.  Sign up information is provided on this After Visit Summary.  MyChart is used to connect with patients for Virtual Visits (Telemedicine).  Patients are able to view lab/test results, encounter notes, upcoming appointments, etc.  Non-urgent messages can be sent to your provider as well. To learn more about what you can do with MyChart, please visit --  [email or web address].

## 2023-04-21 NOTE — Progress Notes (Signed)
    Procedures performed today:    None.  Independent interpretation of notes and tests performed by another provider:   None.  Brief History, Exam, Impression, and Recommendations:    BP 126/85 (BP Location: Right Arm, Patient Position: Sitting, Cuff Size: Normal)   Pulse 72   Ht 5' 10 (1.778 m)   Wt 231 lb 12.8 oz (105.1 kg)   SpO2 100%   BMI 33.26 kg/m   Left shoulder pain, unspecified chronicity Assessment & Plan: Patient reports he is doing well and symptoms are much improved from last office visit.  He did work with physical therapy which he found to be very helpful in regards to treatment of symptoms.  Last visit with them was a couple months ago.  He has been working on home exercise program as recommended by physical therapy.  Does not have any specific concerns related to symptoms at this time. Can plan to follow-up as needed.  Consider referral back to PT should symptoms return in the future.   Wellness examination -     CBC with Differential/Platelet; Future -     Comprehensive metabolic panel; Future -     Hemoglobin A1c; Future -     Lipid panel; Future -     TSH Rfx on Abnormal to Free T4; Future  Return in about 3 months (around 07/20/2023) for CPE with fasting labs 1 week prior.   ___________________________________________ Lakoda Ras[place], MD, ABFM, CAQSM Primary Care and Sports Medicine [facility]

## 2023-06-05 ENCOUNTER — Ambulatory Visit: Payer: 59 | Admitting: Podiatry

## 2023-06-05 DIAGNOSIS — Q666 Other congenital valgus deformities of feet: Secondary | ICD-10-CM

## 2023-06-05 DIAGNOSIS — M7752 Other enthesopathy of left foot: Secondary | ICD-10-CM

## 2023-06-05 NOTE — Progress Notes (Signed)
 Subjective:  Patient ID: Terry York, male    DOB: 10-11-1970,  MRN: [id number]  Chief Complaint  Patient presents with   Gout    53 y.o. male presents with the above complaint.  Patient presents with left first metatarsophalangeal joint pain hurts with ambulation worse with pressure he has not seen anyone else prior to seeing me he has a history of gout he was experiencing sick, gout flare.  He would like to do injection as that helps him considerably.  He also does not wear any kind of orthotics denies any other acute issues.   Review of Systems: Negative except as noted in the HPI. Denies N/V/F/Ch.  Past Medical History:  Diagnosis Date   Hypertension    Seizure (HCC) AGE 30   X 1 OR 2 NONE SINCE   Sleep apnea     Current Outpatient Medications:    amLODipine (NORVASC) 5 MG tablet, Take 5 mg by mouth daily., Disp: , Rfl:    atorvastatin (LIPITOR) 10 MG tablet, TAKE 1 TABLET BY MOUTH EVERY DAY, Disp: 90 tablet, Rfl: 1   Continuous Glucose Sensor (DEXCOM G6 SENSOR) MISC, SMARTSIG:1 Each Topical Every 10 Days, Disp: , Rfl:    Continuous Glucose Transmitter (DEXCOM G6 TRANSMITTER) MISC, , Disp: , Rfl:    fexofenadine (ALLEGRA) 180 MG tablet, Take 180 mg by mouth daily. (Patient not taking: Reported on 04/21/2023), Disp: , Rfl:    lisinopril (PRINIVIL,ZESTRIL) 20 MG tablet, Take 20 mg by mouth daily. , Disp: , Rfl: 1   metFORMIN (GLUCOPHAGE) 500 MG tablet, Take 500 mg by mouth daily., Disp: , Rfl:    tadalafil (CIALIS) 5 MG tablet, Take 5 mg by mouth daily. , Disp: , Rfl: 4   testosterone cypionate (DEPOTESTOSTERONE CYPIONATE) 200 MG/ML injection, Inject 200 mg into the muscle every 30 (thirty) days., Disp: , Rfl:    TRULICITY 0.75 MG/0.5ML SOPN, SMARTSIG:0.5 Milliliter(s) SUB-Q Once a Week, Disp: , Rfl:   Social History   Tobacco Use  Smoking Status Never   Passive exposure: Never  Smokeless Tobacco Never    No Known Allergies Objective:  There were no vitals filed for this  visit. There is no height or weight on file to calculate BMI. Constitutional Well developed. Well nourished.  Vascular Dorsalis pedis pulses palpable bilaterally. Posterior tibial pulses palpable bilaterally. Capillary refill normal to all digits.  No cyanosis or clubbing noted. Pedal hair growth normal.  Neurologic Normal speech. Oriented to person, place, and time. Epicritic sensation to light touch grossly present bilaterally.  Dermatologic Nails well groomed and normal in appearance. No open wounds. No skin lesions.  Orthopedic: Pain on palpation left first metatarsophalangeal joint deep intra-articular pain noted pain with range of motion of the joint.  Red hot swollen joint noted.   Radiographs: None Assessment:   1. Capsulitis of metatarsophalangeal (MTP) joint of left foot   2. Pes planovalgus    Plan:  Patient was evaluated and treated and all questions answered.  Left first metatarsophalangeal joint capsulitis with underlying history of gout -All question concerns were discussed with the patient in extensive detail given the amount of pain that he is having he will benefit from steroid injection -A steroid injection was performed at left first metatarsophalangeal joint using 1% plain Lidocaine and 10 mg of Kenalog. This was well tolerated.  Pes planovalgus -I explained to patient the etiology of pes planovalgus and relationship with Planter fasciitis and various treatment options were discussed.  Given patient foot structure in  the setting of Planter fasciitis I believe patient will benefit from custom-made orthotics to help control the hindfoot motion support the arch of the foot and take the stress away from plantar fascial.  Patient agrees with the plan like to proceed with orthotics -Patient was casted for orthotics    No follow-ups on file.

## 2023-07-13 ENCOUNTER — Other Ambulatory Visit (HOSPITAL_BASED_OUTPATIENT_CLINIC_OR_DEPARTMENT_OTHER): Payer: 59

## 2023-07-20 ENCOUNTER — Encounter (HOSPITAL_BASED_OUTPATIENT_CLINIC_OR_DEPARTMENT_OTHER): Payer: 59 | Admitting: Family Medicine

## 2023-08-18 ENCOUNTER — Ambulatory Visit

## 2023-08-18 NOTE — Progress Notes (Signed)
 Patient presents today to pick up custom molded foot orthotics, diagnosed with Capsulitis by Dr. Lydia Sams.   Orthotics were dispensed and fit was satisfactory. Reviewed instructions for break-in and wear. Written instructions given to patient.  Patient will follow up as needed.   Britton Cane Cped, CFo, CFm

## 2023-10-06 ENCOUNTER — Ambulatory Visit (HOSPITAL_BASED_OUTPATIENT_CLINIC_OR_DEPARTMENT_OTHER)

## 2023-10-15 ENCOUNTER — Ambulatory Visit (HOSPITAL_BASED_OUTPATIENT_CLINIC_OR_DEPARTMENT_OTHER): Admitting: Family Medicine

## 2023-11-09 ENCOUNTER — Other Ambulatory Visit (HOSPITAL_BASED_OUTPATIENT_CLINIC_OR_DEPARTMENT_OTHER): Payer: Self-pay | Admitting: *Deleted

## 2023-11-09 DIAGNOSIS — Z0000 Encounter for general adult medical examination without abnormal findings: Secondary | ICD-10-CM

## 2023-11-10 ENCOUNTER — Ambulatory Visit (HOSPITAL_BASED_OUTPATIENT_CLINIC_OR_DEPARTMENT_OTHER): Payer: Self-pay | Admitting: Family Medicine

## 2023-11-10 LAB — CBC WITH DIFFERENTIAL/PLATELET
Basophils Absolute: 0 x10E3/uL (ref 0.0–0.2)
Basos: 1 %
EOS (ABSOLUTE): 0.2 x10E3/uL (ref 0.0–0.4)
Eos: 4 %
Hematocrit: 46.6 % (ref 37.5–51.0)
Hemoglobin: 15.4 g/dL (ref 13.0–17.7)
Immature Grans (Abs): 0 x10E3/uL (ref 0.0–0.1)
Immature Granulocytes: 0 %
Lymphocytes Absolute: 2.1 x10E3/uL (ref 0.7–3.1)
Lymphs: 49 %
MCH: 31.3 pg (ref 26.6–33.0)
MCHC: 33 g/dL (ref 31.5–35.7)
MCV: 95 fL (ref 79–97)
Monocytes Absolute: 0.5 x10E3/uL (ref 0.1–0.9)
Monocytes: 12 %
Neutrophils Absolute: 1.4 x10E3/uL (ref 1.4–7.0)
Neutrophils: 34 %
Platelets: 287 x10E3/uL (ref 150–450)
RBC: 4.92 x10E6/uL (ref 4.14–5.80)
RDW: 14.3 % (ref 11.6–15.4)
WBC: 4.2 x10E3/uL (ref 3.4–10.8)

## 2023-11-10 LAB — COMPREHENSIVE METABOLIC PANEL WITH GFR
ALT: 40 IU/L (ref 0–44)
AST: 20 IU/L (ref 0–40)
Albumin: 4.6 g/dL (ref 3.8–4.9)
Alkaline Phosphatase: 76 IU/L (ref 44–121)
BUN/Creatinine Ratio: 10 (ref 9–20)
BUN: 15 mg/dL (ref 6–24)
Bilirubin Total: 0.7 mg/dL (ref 0.0–1.2)
CO2: 22 mmol/L (ref 20–29)
Calcium: 9.5 mg/dL (ref 8.7–10.2)
Chloride: 101 mmol/L (ref 96–106)
Creatinine, Ser: 1.53 mg/dL — ABNORMAL HIGH (ref 0.76–1.27)
Globulin, Total: 2.6 g/dL (ref 1.5–4.5)
Glucose: 98 mg/dL (ref 70–99)
Potassium: 4.5 mmol/L (ref 3.5–5.2)
Sodium: 139 mmol/L (ref 134–144)
Total Protein: 7.2 g/dL (ref 6.0–8.5)
eGFR: 55 mL/min/1.73 — ABNORMAL LOW (ref >59)

## 2023-11-10 LAB — HEMOGLOBIN A1C
Est. average glucose Bld gHb Est-mCnc: 126 mg/dL
Hgb A1c MFr Bld: 6 % — ABNORMAL HIGH (ref 4.8–5.6)

## 2023-11-10 LAB — LIPID PANEL
Chol/HDL Ratio: 4.6 ratio (ref 0.0–5.0)
Cholesterol, Total: 146 mg/dL (ref 100–199)
HDL: 32 mg/dL — ABNORMAL LOW (ref >39)
LDL Chol Calc (NIH): 85 mg/dL (ref 0–99)
Triglycerides: 166 mg/dL — ABNORMAL HIGH (ref 0–149)
VLDL Cholesterol Cal: 29 mg/dL (ref 5–40)

## 2023-11-10 LAB — TSH RFX ON ABNORMAL TO FREE T4: TSH: 1.7 u[IU]/mL (ref 0.450–4.500)

## 2023-11-18 ENCOUNTER — Ambulatory Visit (HOSPITAL_BASED_OUTPATIENT_CLINIC_OR_DEPARTMENT_OTHER): Admitting: Family Medicine

## 2023-12-11 ENCOUNTER — Ambulatory Visit (HOSPITAL_BASED_OUTPATIENT_CLINIC_OR_DEPARTMENT_OTHER): Admitting: Family Medicine

## 2024-01-20 ENCOUNTER — Encounter (HOSPITAL_BASED_OUTPATIENT_CLINIC_OR_DEPARTMENT_OTHER): Payer: Self-pay | Admitting: Family Medicine

## 2024-01-20 ENCOUNTER — Ambulatory Visit (HOSPITAL_BASED_OUTPATIENT_CLINIC_OR_DEPARTMENT_OTHER): Admitting: Family Medicine

## 2024-01-20 VITALS — BP 132/84 | HR 76 | Ht 70.0 in | Wt 229.0 lb

## 2024-01-20 DIAGNOSIS — Z23 Encounter for immunization: Secondary | ICD-10-CM | POA: Diagnosis not present

## 2024-01-20 DIAGNOSIS — R7303 Prediabetes: Secondary | ICD-10-CM

## 2024-01-20 DIAGNOSIS — I10 Essential (primary) hypertension: Secondary | ICD-10-CM

## 2024-01-20 DIAGNOSIS — M542 Cervicalgia: Secondary | ICD-10-CM | POA: Insufficient documentation

## 2024-01-20 NOTE — Assessment & Plan Note (Signed)
Blood pressure appropriate in office today, can continue with current medication regimen, no changes made today Recommend intermittent monitoring of blood pressure at home, DASH diet

## 2024-01-20 NOTE — Assessment & Plan Note (Signed)
 Patient reports that in recent weeks he has been experiencing some left neck pain.  This primarily extends from neck down the shoulder, no involvement of left upper extremity.  Notes pain with certain positions, may notice when he lays down to sleep, however does not keep him from being able to fall asleep.  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 On exam, patient does not have any significant tenderness to palpation through spinous processes within cervical or thoracic region, no significant tenderness to palpation through left trapezius.  Normal range of motion at left shoulder, no neurologic deficits. We discussed considerations, can proceed with referral to physical therapy.  Given that symptoms are relatively new, could monitor for symptom progress and arrange for PT evaluation to be set up in a few weeks or so in case symptoms do persist.

## 2024-01-20 NOTE — Progress Notes (Signed)
    Procedures performed today:    None.  Independent interpretation of notes and tests performed by another provider:   None.  Brief History, Exam, Impression, and Recommendations:    BP 132/84 (BP Location: Right Arm, Patient Position: Sitting, Cuff Size: Normal)   Pulse 76   Ht 5' 10 (1.778 m)   Wt 229 lb (103.9 kg)   SpO2 99%   BMI 32.86 kg/m   Neck pain Assessment & Plan: Patient reports that in recent weeks he has been experiencing some left neck pain.  This primarily extends from neck down the shoulder, no involvement of left upper extremity.  Notes pain with certain positions, may notice when he lays down to sleep, however does not keep him from being able to fall asleep.  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 On exam, patient does not have any significant tenderness to palpation through spinous processes within cervical or thoracic region, no significant tenderness to palpation through left trapezius.  Normal range of motion at left shoulder, no neurologic deficits. We discussed considerations, can proceed with referral to physical therapy.  Given that symptoms are relatively new, could monitor for symptom progress and arrange for PT evaluation to be set up in a few weeks or so in case symptoms do persist.  Orders: -     Ambulatory referral to Physical Therapy  Encounter for immunization -     Flu vaccine trivalent PF, 6mos and older(Flulaval,Afluria,Fluarix,Fluzone)  Prediabetes Assessment & Plan: Recent hemoglobin A1c was 6.0%, remains stable from prior A1c.  Currently managing with metformin and Trulicity.  Can continue with current medication regimen. Recommend working towards gradual weight loss   Essential hypertension Assessment & Plan: Blood pressure appropriate in office today, can continue with current medication regimen, no changes made today Recommend intermittent monitoring of blood pressure at home, DASH  diet   Return in about 4 months (around 05/22/2024) for hypertension, prediabetes.   ___________________________________________ Collyn [place], MD, ABFM, CAQSM Primary Care and Sports Medicine [facility]

## 2024-01-20 NOTE — Assessment & Plan Note (Signed)
 Recent hemoglobin A1c was 6.0%, remains stable from prior A1c.  Currently managing with metformin and Trulicity.  Can continue with current medication regimen. Recommend working towards gradual weight loss

## 2024-01-20 NOTE — Patient Instructions (Signed)

## 2024-01-26 ENCOUNTER — Encounter (HOSPITAL_BASED_OUTPATIENT_CLINIC_OR_DEPARTMENT_OTHER): Admitting: Family Medicine

## 2024-06-20 ENCOUNTER — Ambulatory Visit (HOSPITAL_BASED_OUTPATIENT_CLINIC_OR_DEPARTMENT_OTHER): Admitting: Family Medicine
